# Patient Record
Sex: Female | Born: 1955 | Race: White | Hispanic: Yes | Marital: Married | Smoking: Former smoker
Health system: Southern US, Community
[De-identification: ages and names within clinical notes are randomized; demographics above are authoritative.]

## PROBLEM LIST (undated history)

## (undated) DIAGNOSIS — M069 Rheumatoid arthritis, unspecified: Secondary | ICD-10-CM

## (undated) DIAGNOSIS — M359 Systemic involvement of connective tissue, unspecified: Secondary | ICD-10-CM

## (undated) DIAGNOSIS — M4800 Spinal stenosis, site unspecified: Secondary | ICD-10-CM

## (undated) DIAGNOSIS — I10 Essential (primary) hypertension: Secondary | ICD-10-CM

## (undated) HISTORY — PX: ELBOW ARTHROSCOPY: SHX614

## (undated) HISTORY — PX: ANKLE SURGERY: SHX546

---

## 2006-03-31 ENCOUNTER — Other Ambulatory Visit: Admission: RE | Admit: 2006-03-31 | Discharge: 2006-03-31 | Payer: Self-pay | Admitting: Obstetrics and Gynecology

## 2006-04-29 ENCOUNTER — Encounter: Admission: RE | Admit: 2006-04-29 | Discharge: 2006-04-29 | Payer: Self-pay | Admitting: Obstetrics and Gynecology

## 2007-05-03 ENCOUNTER — Encounter: Admission: RE | Admit: 2007-05-03 | Discharge: 2007-05-03 | Payer: Self-pay | Admitting: Obstetrics and Gynecology

## 2007-05-03 ENCOUNTER — Other Ambulatory Visit: Admission: RE | Admit: 2007-05-03 | Discharge: 2007-05-03 | Payer: Self-pay | Admitting: Obstetrics and Gynecology

## 2007-08-16 ENCOUNTER — Ambulatory Visit (HOSPITAL_COMMUNITY): Admission: RE | Admit: 2007-08-16 | Discharge: 2007-08-16 | Payer: Self-pay | Admitting: Gastroenterology

## 2008-05-04 ENCOUNTER — Encounter: Admission: RE | Admit: 2008-05-04 | Discharge: 2008-05-04 | Payer: Self-pay | Admitting: Obstetrics and Gynecology

## 2008-05-04 ENCOUNTER — Other Ambulatory Visit: Admission: RE | Admit: 2008-05-04 | Discharge: 2008-05-04 | Payer: Self-pay | Admitting: Obstetrics and Gynecology

## 2008-09-12 ENCOUNTER — Encounter (INDEPENDENT_AMBULATORY_CARE_PROVIDER_SITE_OTHER): Payer: Self-pay | Admitting: *Deleted

## 2008-09-12 ENCOUNTER — Ambulatory Visit (HOSPITAL_COMMUNITY): Admission: RE | Admit: 2008-09-12 | Discharge: 2008-09-12 | Payer: Self-pay | Admitting: *Deleted

## 2009-05-10 ENCOUNTER — Other Ambulatory Visit: Admission: RE | Admit: 2009-05-10 | Discharge: 2009-05-10 | Payer: Self-pay | Admitting: Obstetrics and Gynecology

## 2009-05-10 ENCOUNTER — Encounter: Admission: RE | Admit: 2009-05-10 | Discharge: 2009-05-10 | Payer: Self-pay | Admitting: Obstetrics and Gynecology

## 2010-05-16 ENCOUNTER — Encounter: Admission: RE | Admit: 2010-05-16 | Discharge: 2010-05-16 | Payer: Self-pay | Admitting: Obstetrics and Gynecology

## 2010-05-16 ENCOUNTER — Other Ambulatory Visit: Admission: RE | Admit: 2010-05-16 | Discharge: 2010-05-16 | Payer: Self-pay | Admitting: Obstetrics and Gynecology

## 2010-05-22 ENCOUNTER — Encounter: Admission: RE | Admit: 2010-05-22 | Discharge: 2010-05-22 | Payer: Self-pay | Admitting: Obstetrics and Gynecology

## 2011-01-19 ENCOUNTER — Encounter: Payer: Self-pay | Admitting: Obstetrics and Gynecology

## 2011-02-21 ENCOUNTER — Emergency Department (HOSPITAL_BASED_OUTPATIENT_CLINIC_OR_DEPARTMENT_OTHER)
Admission: EM | Admit: 2011-02-21 | Discharge: 2011-02-21 | Disposition: A | Discharge status: Home / Self Care | Payer: Managed Care, Other (non HMO) | Attending: Emergency Medicine | Admitting: Emergency Medicine

## 2011-02-21 ENCOUNTER — Emergency Department (INDEPENDENT_AMBULATORY_CARE_PROVIDER_SITE_OTHER): Payer: Managed Care, Other (non HMO)

## 2011-02-21 DIAGNOSIS — R51 Headache: Secondary | ICD-10-CM | POA: Insufficient documentation

## 2011-02-21 DIAGNOSIS — R42 Dizziness and giddiness: Secondary | ICD-10-CM | POA: Insufficient documentation

## 2011-02-21 DIAGNOSIS — K219 Gastro-esophageal reflux disease without esophagitis: Secondary | ICD-10-CM | POA: Insufficient documentation

## 2011-02-21 DIAGNOSIS — M069 Rheumatoid arthritis, unspecified: Secondary | ICD-10-CM | POA: Insufficient documentation

## 2011-02-21 DIAGNOSIS — I10 Essential (primary) hypertension: Secondary | ICD-10-CM

## 2011-02-21 DIAGNOSIS — R079 Chest pain, unspecified: Secondary | ICD-10-CM | POA: Insufficient documentation

## 2011-02-21 DIAGNOSIS — M542 Cervicalgia: Secondary | ICD-10-CM | POA: Insufficient documentation

## 2011-02-21 DIAGNOSIS — R002 Palpitations: Secondary | ICD-10-CM | POA: Insufficient documentation

## 2011-02-21 DIAGNOSIS — R0789 Other chest pain: Secondary | ICD-10-CM

## 2011-02-21 LAB — COMPREHENSIVE METABOLIC PANEL
Albumin: 4.7 g/dL (ref 3.5–5.2)
BUN: 19 mg/dL (ref 6–23)
CO2: 31 mEq/L (ref 19–32)
Calcium: 10.1 mg/dL (ref 8.4–10.5)
GFR calc non Af Amer: >60 mL/min (ref >60)
Potassium: 4.2 mEq/L (ref 3.5–5.1)
Total Protein: 9.1 g/dL — ABNORMAL HIGH (ref 6.0–8.3)

## 2011-02-21 LAB — URINALYSIS, ROUTINE W REFLEX MICROSCOPIC
Hgb urine dipstick: NEGATIVE
Ketones, ur: NEGATIVE mg/dL
Protein, ur: NEGATIVE mg/dL
Specific Gravity, Urine: 1.007 (ref 1.005–1.030)
pH: 6.5 (ref 5.0–8.0)

## 2011-02-21 LAB — DIFFERENTIAL
Basophils Absolute: 0 10*3/uL (ref 0.0–0.1)
Basophils Relative: 0 % (ref 0–1)
Eosinophils Absolute: 0.2 10*3/uL (ref 0.0–0.7)
Lymphocytes Relative: 44 % (ref 12–46)
Monocytes Absolute: 0.5 10*3/uL (ref 0.1–1.0)

## 2011-02-21 LAB — CBC
HCT: 41.4 % (ref 36.0–46.0)
Hemoglobin: 14.6 g/dL (ref 12.0–15.0)
MCH: 31.7 pg (ref 26.0–34.0)
MCV: 90 fL (ref 78.0–100.0)
Platelets: 181 10*3/uL (ref 150–400)
RDW: 13.9 % (ref 11.5–15.5)
WBC: 5.7 10*3/uL (ref 4.0–10.5)

## 2011-02-21 LAB — POCT CARDIAC MARKERS: Troponin i, poc: <0.05 ng/mL (ref 0.00–0.09)

## 2011-04-01 ENCOUNTER — Other Ambulatory Visit: Payer: Self-pay | Admitting: Obstetrics and Gynecology

## 2011-04-01 DIAGNOSIS — Z1231 Encounter for screening mammogram for malignant neoplasm of breast: Secondary | ICD-10-CM

## 2011-05-13 NOTE — Op Note (Signed)
Sharon Bradshaw, COCKE NO.:  [id number]   MEDICAL RECORD NO.:  [id number]          PATIENT TYPE:  AMB   LOCATION:  DAY                          FACILITY:  [facility]   PHYSICIAN:  Alfonse Ras, MD   DATE OF BIRTH:  05-20-1956   DATE OF PROCEDURE:  DATE OF DISCHARGE:                               OPERATIVE REPORT   PREOPERATIVE DIAGNOSIS:  Grade III internal hemorrhoids.   POSTOPERATIVE DIAGNOSIS:  Grade III internal hemorrhoids.   PROCEDURE:  PTH rectopexy.   SURGEON:  Alfonse Ras, M.D.   ANESTHESIA:  General.   DESCRIPTION:  The patient was taken to the operating room after long  discussion about the risks, benefits, and options of PPH  hemorrhoidectomy with which she wanted to proceed.  She underwent  general anesthesia by endotracheal tube and was placed in a prone jack-  knife position.  Anal dilatation was accomplished with 3 fingerbreadths.  Rectal and perianal prep were undertaken with Betadine.  The  hemorrhoidal bundles were injected with 0.25 Marcaine with Wydase in all  3 quadrants.  The sphincter muscles were then injected with additional  0.25% Marcaine.  A 2-0 Prolene pursestring suture was placed  approximately 5 cm proximal to the dentate line through the mucosa and  submucosa.  Stapler was then introduced and the vagina was checked after  it was closed.  The stapler moved freely.  This was held in place for 45  seconds and then fired.  Staple line was inspected.  There was no  bleeding.  The doughnut was inspected.  There was no evidence of  muscularis on the specimen.  We had a complete doughnut measuring about  1.5 cm to 2 cm in length.  Gelfoam packing was placed.  The patient  tolerated the procedure well and went to PACU in good condition.      Alfonse Ras, MD  Electronically Signed     KRE/MEDQ  D:  09/12/2008  T:  09/12/2008  Job:  [id number]

## 2011-05-13 NOTE — Op Note (Signed)
Sharon Bradshaw, SINGLE NO.:  [id number]   MEDICAL RECORD NO.:  [id number]          PATIENT TYPE:  AMB   LOCATION:  ENDO                         FACILITY:  [facility]   PHYSICIAN:  Petra Kuba, M.D.    DATE OF BIRTH:  1956-12-29   DATE OF PROCEDURE:  08/16/2007  DATE OF DISCHARGE:                               OPERATIVE REPORT   PROCEDURE:  Colonoscopy.   INDICATION:  Screening.   Consent was signed after risks, benefits, methods, options thoroughly  discussed by my nurse and me prior to procedure, by the nurse in the  office.   MEDICINES:  1. Fentanyl 100 mcg.  2. Versed 8 mg.   PROCEDURE:  Rectal inspection is pertinent for external hemorrhoids,  moderate.  Digital exam was negative.  A pediatric colonoscope was  inserted and with abdominal pressure easily able to be advanced to the  cecum.  The preparation was adequate, there was a moderate amount of  liquid stool that was easily suctioned and washed.  The cecum was  identified by the appendiceal orifice and the ileocecal valve.  No  abnormalities were seen on insertion.  On slow withdrawal through the  colon, the cecum, ascending and transverse were normal.  Just distal to  the splenic flexure a questionable tiny polyp was seen; however, as we  passed the biopsy forceps and placed them there __________ we could not  find it with multiple advancements and withdrawals.  We elected to  withdraw further.  No other abnormalities were seen as we withdrew back  to the rectum.  Anorectal pull-through and retroflexion confirmed some  small hemorrhoids.  Scope was straightened and readvanced a short ways  up the left side of the colon.  Air was suctioned, scope removed.  Patient tolerated the procedure well.  There was no obvious immediate  complication.   ENDOSCOPIC DIAGNOSES:  1. External greater than internal hemorrhoids.  2. Questionable tiny proximal descending polyp not biopsied, possibly      flattened out  with air insufflation.  3. Otherwise within normal limits to the cecum.  Have her seen back      p.r.n.  Otherwise, repeat colon screening in 5 years just to make      sure the tiny polyp seen was not just missed.           ______________________________  Petra Kuba, M.D.     MEM/MEDQ  D:  08/16/2007  T:  08/16/2007  Job:  [id number]   cc:   Gerald Leitz, MD   Joycelyn Rua, M.D.  Fax: [phone]

## 2011-05-22 ENCOUNTER — Ambulatory Visit: Payer: Managed Care, Other (non HMO)

## 2011-06-10 ENCOUNTER — Other Ambulatory Visit: Payer: Self-pay | Admitting: Obstetrics and Gynecology

## 2011-06-10 ENCOUNTER — Other Ambulatory Visit (HOSPITAL_COMMUNITY)
Admission: RE | Admit: 2011-06-10 | Discharge: 2011-06-10 | Disposition: A | Discharge status: Home / Self Care | Payer: Managed Care, Other (non HMO) | Source: Ambulatory Visit | Attending: Obstetrics and Gynecology | Admitting: Obstetrics and Gynecology

## 2011-06-10 ENCOUNTER — Ambulatory Visit
Admission: RE | Admit: 2011-06-10 | Discharge: 2011-06-10 | Disposition: A | Discharge status: Home / Self Care | Payer: Managed Care, Other (non HMO) | Source: Ambulatory Visit | Attending: Obstetrics and Gynecology | Admitting: Obstetrics and Gynecology

## 2011-06-10 DIAGNOSIS — Z01419 Encounter for gynecological examination (general) (routine) without abnormal findings: Secondary | ICD-10-CM | POA: Insufficient documentation

## 2011-06-10 DIAGNOSIS — Z1231 Encounter for screening mammogram for malignant neoplasm of breast: Secondary | ICD-10-CM

## 2011-10-01 LAB — BASIC METABOLIC PANEL
BUN: 15
CO2: 34 — ABNORMAL HIGH
Calcium: 10.3
Chloride: 102
Creatinine, Ser: 0.68
GFR calc Af Amer: >60
Sodium: 142

## 2012-05-05 ENCOUNTER — Other Ambulatory Visit: Payer: Self-pay | Admitting: Obstetrics and Gynecology

## 2012-05-05 DIAGNOSIS — Z1231 Encounter for screening mammogram for malignant neoplasm of breast: Secondary | ICD-10-CM

## 2012-06-10 ENCOUNTER — Other Ambulatory Visit (HOSPITAL_COMMUNITY)
Admission: RE | Admit: 2012-06-10 | Discharge: 2012-06-10 | Disposition: A | Discharge status: Home / Self Care | Payer: BC Managed Care – PPO | Source: Ambulatory Visit | Attending: Obstetrics and Gynecology | Admitting: Obstetrics and Gynecology

## 2012-06-10 ENCOUNTER — Other Ambulatory Visit: Payer: Self-pay | Admitting: Obstetrics and Gynecology

## 2012-06-10 ENCOUNTER — Ambulatory Visit
Admission: RE | Admit: 2012-06-10 | Discharge: 2012-06-10 | Disposition: A | Discharge status: Home / Self Care | Payer: BC Managed Care – PPO | Source: Ambulatory Visit | Attending: Obstetrics and Gynecology | Admitting: Obstetrics and Gynecology

## 2012-06-10 DIAGNOSIS — Z1231 Encounter for screening mammogram for malignant neoplasm of breast: Secondary | ICD-10-CM

## 2012-06-10 DIAGNOSIS — Z01419 Encounter for gynecological examination (general) (routine) without abnormal findings: Secondary | ICD-10-CM | POA: Insufficient documentation

## 2012-06-10 DIAGNOSIS — Z1159 Encounter for screening for other viral diseases: Secondary | ICD-10-CM | POA: Insufficient documentation

## 2013-06-16 ENCOUNTER — Other Ambulatory Visit: Payer: Self-pay

## 2013-06-16 DIAGNOSIS — Z1231 Encounter for screening mammogram for malignant neoplasm of breast: Secondary | ICD-10-CM

## 2013-08-02 ENCOUNTER — Ambulatory Visit
Admission: RE | Admit: 2013-08-02 | Discharge: 2013-08-02 | Disposition: A | Discharge status: Home / Self Care | Payer: BC Managed Care – PPO | Source: Ambulatory Visit

## 2013-08-02 DIAGNOSIS — Z1231 Encounter for screening mammogram for malignant neoplasm of breast: Secondary | ICD-10-CM

## 2014-02-04 ENCOUNTER — Encounter: Payer: Self-pay | Admitting: *Deleted

## 2014-02-04 DIAGNOSIS — M8580 Other specified disorders of bone density and structure, unspecified site: Secondary | ICD-10-CM

## 2014-02-04 DIAGNOSIS — I10 Essential (primary) hypertension: Secondary | ICD-10-CM | POA: Insufficient documentation

## 2014-03-28 ENCOUNTER — Other Ambulatory Visit: Payer: Self-pay

## 2014-07-18 ENCOUNTER — Other Ambulatory Visit: Payer: Self-pay

## 2014-07-18 DIAGNOSIS — Z1231 Encounter for screening mammogram for malignant neoplasm of breast: Secondary | ICD-10-CM

## 2014-08-16 ENCOUNTER — Ambulatory Visit: Payer: BC Managed Care – PPO

## 2014-09-12 ENCOUNTER — Other Ambulatory Visit: Payer: Self-pay | Admitting: Obstetrics and Gynecology

## 2014-09-12 ENCOUNTER — Encounter (INDEPENDENT_AMBULATORY_CARE_PROVIDER_SITE_OTHER): Payer: Self-pay

## 2014-09-12 ENCOUNTER — Other Ambulatory Visit (HOSPITAL_COMMUNITY)
Admission: RE | Admit: 2014-09-12 | Discharge: 2014-09-12 | Disposition: A | Discharge status: Home / Self Care | Payer: BC Managed Care – PPO | Source: Ambulatory Visit | Attending: Obstetrics and Gynecology | Admitting: Obstetrics and Gynecology

## 2014-09-12 ENCOUNTER — Ambulatory Visit
Admission: RE | Admit: 2014-09-12 | Discharge: 2014-09-12 | Disposition: A | Discharge status: Home / Self Care | Payer: BC Managed Care – PPO | Source: Ambulatory Visit

## 2014-09-12 DIAGNOSIS — Z01419 Encounter for gynecological examination (general) (routine) without abnormal findings: Secondary | ICD-10-CM | POA: Diagnosis present

## 2014-09-12 DIAGNOSIS — Z1231 Encounter for screening mammogram for malignant neoplasm of breast: Secondary | ICD-10-CM

## 2014-09-13 LAB — CYTOLOGY - PAP

## 2015-07-03 ENCOUNTER — Other Ambulatory Visit: Payer: Self-pay

## 2015-07-03 DIAGNOSIS — Z1231 Encounter for screening mammogram for malignant neoplasm of breast: Secondary | ICD-10-CM

## 2015-09-20 ENCOUNTER — Other Ambulatory Visit: Payer: Self-pay | Admitting: Obstetrics and Gynecology

## 2015-09-20 ENCOUNTER — Ambulatory Visit
Admission: RE | Admit: 2015-09-20 | Discharge: 2015-09-20 | Disposition: A | Discharge status: Home / Self Care | Payer: BLUE CROSS/BLUE SHIELD | Source: Ambulatory Visit

## 2015-09-20 ENCOUNTER — Other Ambulatory Visit (HOSPITAL_COMMUNITY)
Admission: RE | Admit: 2015-09-20 | Discharge: 2015-09-20 | Disposition: A | Discharge status: Home / Self Care | Payer: BLUE CROSS/BLUE SHIELD | Source: Ambulatory Visit | Attending: Obstetrics and Gynecology | Admitting: Obstetrics and Gynecology

## 2015-09-20 DIAGNOSIS — Z1151 Encounter for screening for human papillomavirus (HPV): Secondary | ICD-10-CM | POA: Insufficient documentation

## 2015-09-20 DIAGNOSIS — Z01419 Encounter for gynecological examination (general) (routine) without abnormal findings: Secondary | ICD-10-CM | POA: Diagnosis present

## 2015-09-20 DIAGNOSIS — Z1231 Encounter for screening mammogram for malignant neoplasm of breast: Secondary | ICD-10-CM

## 2015-09-24 LAB — CYTOLOGY - PAP

## 2015-10-29 ENCOUNTER — Inpatient Hospital Stay (HOSPITAL_BASED_OUTPATIENT_CLINIC_OR_DEPARTMENT_OTHER)
Admission: EM | Admit: 2015-10-29 | Discharge: 2015-10-31 | DRG: 337 | Disposition: A | Discharge status: Home / Self Care | Payer: BLUE CROSS/BLUE SHIELD | Attending: General Surgery | Admitting: General Surgery

## 2015-10-29 ENCOUNTER — Ambulatory Visit (HOSPITAL_BASED_OUTPATIENT_CLINIC_OR_DEPARTMENT_OTHER)
Admission: RE | Admit: 2015-10-29 | Discharge: 2015-10-29 | Disposition: A | Discharge status: Home / Self Care | Payer: BLUE CROSS/BLUE SHIELD | Source: Ambulatory Visit | Attending: Family Medicine | Admitting: Family Medicine

## 2015-10-29 ENCOUNTER — Encounter (HOSPITAL_BASED_OUTPATIENT_CLINIC_OR_DEPARTMENT_OTHER): Payer: Self-pay | Admitting: *Deleted

## 2015-10-29 ENCOUNTER — Other Ambulatory Visit (HOSPITAL_BASED_OUTPATIENT_CLINIC_OR_DEPARTMENT_OTHER): Payer: Self-pay | Admitting: Family Medicine

## 2015-10-29 ENCOUNTER — Encounter (HOSPITAL_BASED_OUTPATIENT_CLINIC_OR_DEPARTMENT_OTHER): Payer: Self-pay

## 2015-10-29 DIAGNOSIS — Z888 Allergy status to other drugs, medicaments and biological substances status: Secondary | ICD-10-CM

## 2015-10-29 DIAGNOSIS — Z886 Allergy status to analgesic agent status: Secondary | ICD-10-CM

## 2015-10-29 DIAGNOSIS — M069 Rheumatoid arthritis, unspecified: Secondary | ICD-10-CM | POA: Diagnosis present

## 2015-10-29 DIAGNOSIS — Z833 Family history of diabetes mellitus: Secondary | ICD-10-CM

## 2015-10-29 DIAGNOSIS — I10 Essential (primary) hypertension: Secondary | ICD-10-CM | POA: Diagnosis present

## 2015-10-29 DIAGNOSIS — R1084 Generalized abdominal pain: Secondary | ICD-10-CM

## 2015-10-29 DIAGNOSIS — Z87891 Personal history of nicotine dependence: Secondary | ICD-10-CM

## 2015-10-29 DIAGNOSIS — Z8249 Family history of ischemic heart disease and other diseases of the circulatory system: Secondary | ICD-10-CM

## 2015-10-29 DIAGNOSIS — K3580 Unspecified acute appendicitis: Principal | ICD-10-CM | POA: Diagnosis present

## 2015-10-29 DIAGNOSIS — M359 Systemic involvement of connective tissue, unspecified: Secondary | ICD-10-CM | POA: Diagnosis present

## 2015-10-29 DIAGNOSIS — K660 Peritoneal adhesions (postprocedural) (postinfection): Secondary | ICD-10-CM | POA: Diagnosis present

## 2015-10-29 DIAGNOSIS — Z91040 Latex allergy status: Secondary | ICD-10-CM

## 2015-10-29 DIAGNOSIS — Z809 Family history of malignant neoplasm, unspecified: Secondary | ICD-10-CM

## 2015-10-29 DIAGNOSIS — Z885 Allergy status to narcotic agent status: Secondary | ICD-10-CM

## 2015-10-29 HISTORY — DX: Essential (primary) hypertension: I10

## 2015-10-29 HISTORY — DX: Systemic involvement of connective tissue, unspecified: M35.9

## 2015-10-29 HISTORY — DX: Rheumatoid arthritis, unspecified: M06.9

## 2015-10-29 HISTORY — DX: Spinal stenosis, site unspecified: M48.00

## 2015-10-29 LAB — COMPREHENSIVE METABOLIC PANEL
ALK PHOS: 73 U/L (ref 38–126)
ALT: 32 U/L (ref 14–54)
AST: 30 U/L (ref 15–41)
Albumin: 4.6 g/dL (ref 3.5–5.0)
Anion gap: 11 (ref 5–15)
BUN: 14 mg/dL (ref 6–20)
CALCIUM: 9.3 mg/dL (ref 8.9–10.3)
CHLORIDE: 96 mmol/L — AB (ref 101–111)
CO2: 25 mmol/L (ref 22–32)
CREATININE: 0.59 mg/dL (ref 0.44–1.00)
GFR calc Af Amer: >60 mL/min (ref >60)
GFR calc non Af Amer: >60 mL/min (ref >60)
Glucose, Bld: 120 mg/dL — ABNORMAL HIGH (ref 65–99)
Potassium: 3.4 mmol/L — ABNORMAL LOW (ref 3.5–5.1)
Sodium: 132 mmol/L — ABNORMAL LOW (ref 135–145)
Total Bilirubin: 1 mg/dL (ref 0.3–1.2)
Total Protein: 8.7 g/dL — ABNORMAL HIGH (ref 6.5–8.1)

## 2015-10-29 MED ORDER — ONDANSETRON HCL 4 MG/2ML IJ SOLN
4.0000 mg | Freq: Once | INTRAMUSCULAR | Status: AC | PRN
  Administered 2015-10-30: 4 mg via INTRAVENOUS

## 2015-10-29 MED ORDER — ACETAMINOPHEN 325 MG PO TABS
650.0000 mg | ORAL_TABLET | Freq: Once | ORAL | Status: AC
Start: 2015-10-29 — End: 2015-10-29
  Administered 2015-10-29: 650 mg via ORAL
  Filled 2015-10-29: qty 2

## 2015-10-29 MED ORDER — HYDROMORPHONE HCL 1 MG/ML IJ SOLN: 1.0000 mg | INTRAMUSCULAR | Status: DC | PRN

## 2015-10-29 MED ORDER — CEFTRIAXONE SODIUM 2 G IJ SOLR
2.0000 g | INTRAMUSCULAR | Status: DC
  Administered 2015-10-30: 2 g via INTRAVENOUS
  Filled 2015-10-29: qty 2

## 2015-10-29 MED ORDER — ACETAMINOPHEN 325 MG PO TABS: 650.0000 mg | ORAL_TABLET | Freq: Four times a day (QID) | ORAL | Status: DC | PRN

## 2015-10-29 MED ORDER — ONDANSETRON HCL 4 MG/2ML IJ SOLN
4.0000 mg | Freq: Once | INTRAMUSCULAR | Status: AC
  Administered 2015-10-29: 4 mg via INTRAVENOUS
  Filled 2015-10-29: qty 2

## 2015-10-29 MED ORDER — SODIUM CHLORIDE 0.9 % IV BOLUS (SEPSIS)
1000.0000 mL | Freq: Once | INTRAVENOUS | Status: AC
  Administered 2015-10-29: 1000 mL via INTRAVENOUS

## 2015-10-29 MED ORDER — ONDANSETRON 4 MG PO TBDP: 4.0000 mg | ORAL_TABLET | Freq: Four times a day (QID) | ORAL | Status: DC | PRN

## 2015-10-29 MED ORDER — ACETAMINOPHEN 650 MG RE SUPP: 650.0000 mg | Freq: Four times a day (QID) | RECTAL | Status: DC | PRN

## 2015-10-29 MED ORDER — METRONIDAZOLE IN NACL 5-0.79 MG/ML-% IV SOLN
500.0000 mg | Freq: Three times a day (TID) | INTRAVENOUS | Status: DC
  Administered 2015-10-30 (×2): 500 mg via INTRAVENOUS
  Filled 2015-10-29 (×3): qty 100

## 2015-10-29 MED ORDER — AMPICILLIN-SULBACTAM SODIUM 3 (2-1) G IJ SOLR
3.0000 g | Freq: Once | INTRAMUSCULAR | Status: AC
  Administered 2015-10-29: 3 g via INTRAVENOUS
  Filled 2015-10-29: qty 3

## 2015-10-29 MED ORDER — MORPHINE SULFATE (PF) 4 MG/ML IV SOLN: 4.0000 mg | INTRAVENOUS | Status: DC | PRN

## 2015-10-29 MED ORDER — FENTANYL CITRATE (PF) 100 MCG/2ML IJ SOLN
50.0000 ug | Freq: Once | INTRAMUSCULAR | Status: AC
  Administered 2015-10-29: 50 ug via INTRAVENOUS
  Filled 2015-10-29: qty 2

## 2015-10-29 MED ORDER — IOHEXOL 300 MG/ML  SOLN
100.0000 mL | Freq: Once | INTRAMUSCULAR | Status: AC | PRN
  Administered 2015-10-29: 100 mL via INTRAVENOUS

## 2015-10-29 MED ORDER — ONDANSETRON HCL 4 MG/2ML IJ SOLN: 4.0000 mg | Freq: Four times a day (QID) | INTRAMUSCULAR | Status: DC | PRN

## 2015-10-29 MED ORDER — KCL IN DEXTROSE-NACL 20-5-0.45 MEQ/L-%-% IV SOLN
INTRAVENOUS | Status: DC
  Administered 2015-10-30: via INTRAVENOUS
  Filled 2015-10-29 (×2): qty 1000

## 2015-10-29 NOTE — Anesthesia Preprocedure Evaluation (Addendum)
Anesthesia Evaluation  Patient identified by MRN, date of birth, ID band Patient awake    Reviewed: Allergy & Precautions, H&P , NPO status , Patient's Chart, lab work & pertinent test results  Airway Mallampati: II       Dental  (+) Dental Advisory Given, Caps Right upper front capped:   Pulmonary neg pulmonary ROS, former smoker,    Pulmonary exam normal        Cardiovascular hypertension, Pt. on medications Normal cardiovascular exam     Neuro/Psych negative neurological ROS  negative psych ROS   GI/Hepatic negative GI ROS, Neg liver ROS,   Endo/Other  negative endocrine ROS  Renal/GU negative Renal ROS  negative genitourinary   Musculoskeletal  (+) Arthritis , Rheumatoid disorders,    Abdominal   Peds  Hematology negative hematology ROS (+)   Anesthesia Other Findings Collagen vascular disease  Reproductive/Obstetrics negative OB ROS                          Anesthesia Physical Anesthesia Plan  ASA: III  Anesthesia Plan: General   Post-op Pain Management:    Induction: Intravenous, Rapid sequence and Cricoid pressure planned  Airway Management Planned: Oral ETT  Additional Equipment:   Intra-op Plan:   Post-operative Plan: Extubation in OR  Informed Consent: I have reviewed the patients History and Physical, chart, labs and discussed the procedure including the risks, benefits and alternatives for the proposed anesthesia with the patient or authorized representative who has indicated his/her understanding and acceptance.   Dental advisory given  Plan Discussed with: CRNA  Anesthesia Plan Comments:        Anesthesia Quick Evaluation

## 2015-10-29 NOTE — ED Provider Notes (Signed)
CSN: [id number]     Arrival date & time 10/29/15  1758 History  By signing my name below, I, Tanda Rockers, attest that this documentation has been prepared under the direction and in the presence of Tilden Fossa, MD. Electronically Signed: Tanda Rockers, ED Scribe. 10/29/2015. 6:38 PM.  Chief Complaint  Patient presents with  . Abdominal Pain    Known Appendicitis   The history is provided by the patient. No language interpreter was used.     HPI Comments: Sharon Bradshaw is a 59 y.o. female who presents to the Emergency Department for known appendicitis. Pt states that 3 days ago she began having sudden onset, constant, severe, RLQ abdominal pain that was gradually worsening. Pt also complains of nausea. She was seen at [facility] today for same symptoms and was sent here for an out patient CT with findings of known appendicitis, prompting her to come to the ED. Denies fever, chills, vomiting, or any other associated symptoms. PSHx cesarean section (x2).   Past Medical History  Diagnosis Date  . Hypertension   . Collagen vascular disease (HCC)   . RA (rheumatoid arthritis) (HCC)   . Spinal stenosis    Past Surgical History  Procedure Laterality Date  . Cesarean section    . Elbow arthroscopy    . Ankle surgery  tendon   Family History  Problem Relation Age of Onset  . Coronary artery disease Mother   . Hypertension Mother   . Diabetes Mellitus I Mother   . Hypertension Father   . Coronary artery disease Father   . Cancer Maternal Aunt    Social History  Substance Use Topics  . Smoking status: Former Games developer  . Smokeless tobacco: Former Neurosurgeon    Quit date: 02/05/1980  . Alcohol Use: Yes     Comment: wine daily   OB History    No data available     Review of Systems  Constitutional: Negative for fever and chills.  Gastrointestinal: Positive for nausea and abdominal pain (RLQ). Negative for vomiting and diarrhea.  All other systems reviewed and are  negative.   Allergies  Aspirin; Latex; Lisinopril; and Percocet  Home Medications   Prior to Admission medications   Medication Sig Start Date End Date Taking? Authorizing Provider  calcium carbonate (OS-CAL) 600 MG TABS tablet Take 600 mg by mouth 2 (two) times daily with a meal.   Yes Historical Provider, MD  celecoxib (CELEBREX) 200 MG capsule Take 200 mg by mouth 2 (two) times daily.   Yes Historical Provider, MD  cholecalciferol (VITAMIN D) 1000 UNITS tablet Take 1,000 Units by mouth daily.   Yes Historical Provider, MD  Coenzyme Q10 (COQ-10 PO) Take by mouth daily.   Yes Historical Provider, MD  Cyanocobalamin (VITAMIN B-12 PO) Take by mouth daily.   Yes Historical Provider, MD  cyclobenzaprine (FLEXERIL) 10 MG tablet Take 10 mg by mouth at bedtime.   Yes Historical Provider, MD  hydrochlorothiazide (HYDRODIURIL) 25 MG tablet Take 25 mg by mouth daily.   Yes Historical Provider, MD  lansoprazole (PREVACID) 30 MG capsule Take 30 mg by mouth daily at 12 noon.   Yes Historical Provider, MD  latanoprost (XALATAN) 0.005 % ophthalmic solution 1 drop at bedtime.   Yes Historical Provider, MD  MAGNESIUM-OXIDE PO Take by mouth daily.   Yes Historical Provider, MD  Milk Thistle 140 MG CAPS Take by mouth daily.   Yes Historical Provider, MD  Omega-3 Fatty Acids (FISH OIL) 1000  MG CAPS Take by mouth.   Yes Historical Provider, MD  Tofacitinib Citrate (XELJANZ) 5 MG TABS Take by mouth 2 (two) times daily.   Yes Historical Provider, MD  triamcinolone cream (KENALOG) 0.1 % Apply 1 application topically 2 (two) times daily.   Yes Historical Provider, MD  adalimumab (HUMIRA) 40 MG/0.8ML injection Inject 40 mg into the skin once.    Historical Provider, MD   Triage Vitals: BP 159/99 mmHg  Pulse 100  Temp(Src) 98.8 F (37.1 C) (Oral)  Resp 20  Ht 5' 2.5" (1.588 m)  Wt 170 lb (77.111 kg)  BMI 30.58 kg/m2  SpO2 99%   Physical Exam  Constitutional: She is oriented to person, place, and time. She  appears well-developed and well-nourished.  HENT:  Head: Normocephalic and atraumatic.  Cardiovascular: Normal rate and regular rhythm.   No murmur heard. Pulmonary/Chest: Effort normal and breath sounds normal. No respiratory distress.  Abdominal: Soft. She exhibits distension. There is tenderness. There is guarding. There is no rebound.  Distended abdomen with moderate diffuse tenderness greatest over RLQ Voluntary guarding  No rebound  Musculoskeletal: She exhibits no edema or tenderness.  Neurological: She is alert and oriented to person, place, and time.  Skin: Skin is warm and dry.  Psychiatric: She has a normal mood and affect. Her behavior is normal.  Nursing note and vitals reviewed.   ED Course  Procedures (including critical care time)  DIAGNOSTIC STUDIES: Oxygen Saturation is 99% on RA, normal by my interpretation.    COORDINATION OF CARE: 6:35 PM-Discussed treatment plan which includes consult to general surgery with pt at bedside and pt agreed to plan.   Labs Review Labs Reviewed - No data to display  Imaging Review Ct Abdomen Pelvis W Contrast  10/29/2015  CLINICAL DATA:  Right lower quadrant pain for the past 2 days. Nausea. Low-grade fever. EXAM: CT ABDOMEN AND PELVIS WITH CONTRAST TECHNIQUE: Multidetector CT imaging of the abdomen and pelvis was performed using the standard protocol following bolus administration of intravenous contrast. CONTRAST:  OMNIPAQUE IOHEXOL 300 MG/ML  SOLN COMPARISON:  None. FINDINGS: Diffuse low density of the liver relative to the spleen. Calcified granuloma in the liver. Enlarged appendix containing an appendicolith and fluid with diffuse, enhancing wall thickening and mild periappendiceal soft tissue stranding. The appendicolith measures 10 mm in diameter in the maximum diameter of the appendix is 1.6 cm. No free peritoneal fluid or air. Normal appearing spleen, pancreas, gallbladder, adrenal glands, kidneys, ureters, urinary  bladder, uterus and ovaries. No gastrointestinal abnormalities or enlarged lymph nodes. Clear lung bases. Lumbar and lower thoracic spine degenerative changes. IMPRESSION: 1. Acute appendicitis without abscess. 2. Diffuse hepatic steatosis. These results will be called to the ordering clinician or representative by the Radiologist Assistant, and communication documented in the PACS or zVision Dashboard. Electronically Signed   By: Beckie Salts M.D.   On: 10/29/2015 17:15   I have personally reviewed and evaluated these images and lab results as part of my medical decision-making.   EKG Interpretation None      MDM   Final diagnoses:  Acute appendicitis, unspecified acute appendicitis type   Patient referred following outpatient CT scan for acute appendicitis. History, examination, CT scan is consistent with appendicitis. CBC from [facility] physicians demonstrates white blood cell count of 10.5, hemoglobin of 15.9, platelets of 200. This with Dr. Gerrit Friends who accepted the patient in transfer. Patient will be transferred to the [facility] emergency department pending surgical evaluation.  I personally  performed the services described in this documentation, which was scribed in my presence. The recorded information has been reviewed and is accurate.      Tilden Fossa, MD 10/30/15 [phone]

## 2015-10-29 NOTE — ED Notes (Signed)
Bed: WA24 Expected date:  Expected time:  Means of arrival:  Comments: From [facility]

## 2015-10-29 NOTE — ED Notes (Signed)
Sent from out patient CT with dx of appendicitis

## 2015-10-29 NOTE — H&P (Signed)
Sharon Bradshaw is an 59 y.o. female.    General Surgery [facility]  Chief Complaint: abdominal pain, acute appendicitis  HPI:  59 yo female with 2 day hx of crampy abd pain, nausea, sweats, and chills.  Low grade fever.  Presented to primary MD at [facility], referred to [place] for CT scan.  Scan shows evidence acute appendicitis with an appendicolith.  No sign of perforation.  IV abx started en route to [facility] ER.  Previous C-section x 2.  Past Medical History  Diagnosis Date  . Hypertension   . Collagen vascular disease ([place])   . RA (rheumatoid arthritis) ([place])   . Spinal stenosis     Past Surgical History  Procedure Laterality Date  . Cesarean section    . Elbow arthroscopy    . Ankle surgery  tendon    Family History  Problem Relation Age of Onset  . Coronary artery disease Mother   . Hypertension Mother   . Diabetes Mellitus I Mother   . Hypertension Father   . Coronary artery disease Father   . Cancer Maternal Aunt    Social History:  reports that she has quit smoking. She quit smokeless tobacco use about 35 years ago. She reports that she drinks alcohol. She reports that she does not use illicit drugs.  Allergies:  Allergies  Allergen Reactions  . Aspirin Hives  . Latex Hives  . Lisinopril Swelling  . Percocet [Oxycodone-Acetaminophen]     DIZZINESS      (Not in a hospital admission)  Results for orders placed or performed during the hospital encounter of 10/29/15 (from the past 48 hour(s))  Comprehensive metabolic panel     Status: Abnormal   Collection Time: 10/29/15  6:45 PM  Result Value Ref Range   Sodium 132 (L) 135 - 145 mmol/L   Potassium 3.4 (L) 3.5 - 5.1 mmol/L   Chloride 96 (L) 101 - 111 mmol/L   CO2 25 22 - 32 mmol/L   Glucose, Bld 120 (H) 65 - 99 mg/dL   BUN 14 6 - 20 mg/dL   Creatinine, Ser 0.59 0.44 - 1.00 mg/dL   Calcium 9.3 8.9 - 10.3 mg/dL   Total Protein 8.7 (H) 6.5 - 8.1 g/dL   Albumin 4.6 3.5 - 5.0 g/dL    AST 30 15 - 41 U/L   ALT 32 14 - 54 U/L   Alkaline Phosphatase 73 38 - 126 U/L   Total Bilirubin 1.0 0.3 - 1.2 mg/dL   GFR calc non Af Amer >60 >60 mL/min   GFR calc Af Amer >60 >60 mL/min    Comment: (NOTE) The eGFR has been calculated using the CKD EPI equation. This calculation has not been validated in all clinical situations. eGFR's persistently <60 mL/min signify possible Chronic Kidney Disease.    Anion gap 11 5 - 15   Ct Abdomen Pelvis W Contrast  10/29/2015  CLINICAL DATA:  Right lower quadrant pain for the past 2 days. Nausea. Low-grade fever. EXAM: CT ABDOMEN AND PELVIS WITH CONTRAST TECHNIQUE: Multidetector CT imaging of the abdomen and pelvis was performed using the standard protocol following bolus administration of intravenous contrast. CONTRAST:  111m OMNIPAQUE IOHEXOL 300 MG/ML  SOLN COMPARISON:  None. FINDINGS: Diffuse low density of the liver relative to the spleen. Calcified granuloma in the liver. Enlarged appendix containing an appendicolith and fluid with diffuse, enhancing wall thickening and mild periappendiceal soft tissue stranding. The appendicolith measures 10 mm in  diameter in the maximum diameter of the appendix is 1.6 cm. No free peritoneal fluid or air. Normal appearing spleen, pancreas, gallbladder, adrenal glands, kidneys, ureters, urinary bladder, uterus and ovaries. No gastrointestinal abnormalities or enlarged lymph nodes. Clear lung bases. Lumbar and lower thoracic spine degenerative changes. IMPRESSION: 1. Acute appendicitis without abscess. 2. Diffuse hepatic steatosis. These results will be called to the ordering clinician or representative by the Radiologist Assistant, and communication documented in the PACS or zVision Dashboard. Electronically Signed   By: Claudie Revering M.D.   On: 10/29/2015 17:15    Review of Systems  Constitutional: Positive for fever and chills. Negative for weight loss, malaise/fatigue and diaphoresis.  Eyes: Negative.    Respiratory: Negative.   Cardiovascular: Negative.   Gastrointestinal: Positive for nausea and abdominal pain (localizing to RLQ). Negative for vomiting.  Genitourinary: Negative.   Musculoskeletal: Positive for back pain.  Skin: Negative.   Neurological: Positive for headaches. Negative for weakness.  Endo/Heme/Allergies: Negative.   Psychiatric/Behavioral: Negative.     Blood pressure 150/75, pulse 87, temperature 99.4 F (37.4 C), temperature source Oral, resp. rate 18, height 5' 2.5" (1.588 m), weight 77.111 kg (170 lb), SpO2 94 %. Physical Exam  Constitutional: She is oriented to person, place, and time. She appears well-developed and well-nourished. No distress.  HENT:  Head: Normocephalic and atraumatic.  Right Ear: External ear normal.  Left Ear: External ear normal.  Eyes: Conjunctivae are normal. Pupils are equal, round, and reactive to light. No scleral icterus.  Neck: Normal range of motion. Neck supple. No tracheal deviation present. No thyromegaly present.  Cardiovascular: Normal rate, regular rhythm and normal heart sounds.   No murmur heard. Respiratory: Effort normal and breath sounds normal. No respiratory distress. She has no wheezes.  GI: Soft. Bowel sounds are normal. She exhibits no distension and no mass. There is tenderness (mild diffuse tenderness, max in RLQ). There is guarding. There is no rebound.  Musculoskeletal: Normal range of motion. She exhibits no edema or tenderness.  Neurological: She is alert and oriented to person, place, and time.  Skin: Skin is warm and dry. She is not diaphoretic.  Psychiatric: She has a normal mood and affect. Her behavior is normal.     Assessment/Plan Acute appendicitis  Plan lap appendectomy in AM 11/1 by Dr. Fanny Skates  IV Cefoxitin started en route  Rx pain, nausea  Patient and husband are in agreement with plans for admission and surgery  The risks and benefits of the procedure have been discussed at length  with the patient.  The patient understands the proposed procedure, potential alternative treatments, and the course of recovery to be expected.  All of the patient's questions have been answered at this time.  The patient wishes to proceed with surgery.  Earnstine Regal, MD, [facility] Office: [place] 10/29/2015, 9:50 PM

## 2015-10-29 NOTE — ED Notes (Signed)
Patient transferred from [facility].

## 2015-10-29 NOTE — ED Provider Notes (Signed)
Transfer from [facility] with appendicitis. Received abx prior to transfer. No new complaints since last evaluated. PRN pain meds ordered. Pt/family updated that Dr Gerrit Friends is currently in OR with emergent case and will be by as soon as he is available.   Sharon Razor, MD 10/29/15 2024

## 2015-10-30 ENCOUNTER — Encounter (HOSPITAL_COMMUNITY): Payer: Self-pay | Admitting: Anesthesiology

## 2015-10-30 ENCOUNTER — Observation Stay (HOSPITAL_COMMUNITY): Payer: BLUE CROSS/BLUE SHIELD | Admitting: Anesthesiology

## 2015-10-30 ENCOUNTER — Encounter (HOSPITAL_COMMUNITY)
Admission: EM | Disposition: A | Discharge status: Home / Self Care | Payer: BLUE CROSS/BLUE SHIELD | Source: Home / Self Care

## 2015-10-30 DIAGNOSIS — Z885 Allergy status to narcotic agent status: Secondary | ICD-10-CM | POA: Diagnosis not present

## 2015-10-30 DIAGNOSIS — Z833 Family history of diabetes mellitus: Secondary | ICD-10-CM | POA: Diagnosis not present

## 2015-10-30 DIAGNOSIS — K3580 Unspecified acute appendicitis: Secondary | ICD-10-CM | POA: Diagnosis present

## 2015-10-30 DIAGNOSIS — M069 Rheumatoid arthritis, unspecified: Secondary | ICD-10-CM | POA: Diagnosis present

## 2015-10-30 DIAGNOSIS — I10 Essential (primary) hypertension: Secondary | ICD-10-CM | POA: Diagnosis present

## 2015-10-30 DIAGNOSIS — Z87891 Personal history of nicotine dependence: Secondary | ICD-10-CM | POA: Diagnosis not present

## 2015-10-30 DIAGNOSIS — K660 Peritoneal adhesions (postprocedural) (postinfection): Secondary | ICD-10-CM | POA: Diagnosis present

## 2015-10-30 DIAGNOSIS — Z809 Family history of malignant neoplasm, unspecified: Secondary | ICD-10-CM | POA: Diagnosis not present

## 2015-10-30 DIAGNOSIS — Z888 Allergy status to other drugs, medicaments and biological substances status: Secondary | ICD-10-CM | POA: Diagnosis not present

## 2015-10-30 DIAGNOSIS — Z8249 Family history of ischemic heart disease and other diseases of the circulatory system: Secondary | ICD-10-CM | POA: Diagnosis not present

## 2015-10-30 DIAGNOSIS — Z886 Allergy status to analgesic agent status: Secondary | ICD-10-CM | POA: Diagnosis not present

## 2015-10-30 DIAGNOSIS — M359 Systemic involvement of connective tissue, unspecified: Secondary | ICD-10-CM | POA: Diagnosis present

## 2015-10-30 DIAGNOSIS — Z91040 Latex allergy status: Secondary | ICD-10-CM | POA: Diagnosis not present

## 2015-10-30 HISTORY — PX: LAPAROSCOPIC APPENDECTOMY: SHX408

## 2015-10-30 LAB — CREATININE, SERUM
CREATININE: 0.67 mg/dL (ref 0.44–1.00)
GFR calc Af Amer: >60 mL/min (ref >60)

## 2015-10-30 LAB — CBC
HCT: 43.3 % (ref 36.0–46.0)
Hemoglobin: 14.7 g/dL (ref 12.0–15.0)
MCH: 31.9 pg (ref 26.0–34.0)
MCHC: 33.9 g/dL (ref 30.0–36.0)
MCV: 93.9 fL (ref 78.0–100.0)
PLATELETS: 171 10*3/uL (ref 150–400)
RBC: 4.61 MIL/uL (ref 3.87–5.11)
RDW: 14.2 % (ref 11.5–15.5)
WBC: 8.3 10*3/uL (ref 4.0–10.5)

## 2015-10-30 LAB — SURGICAL PCR SCREEN
MRSA, PCR: NEGATIVE
Staphylococcus aureus: POSITIVE — AB

## 2015-10-30 SURGERY — APPENDECTOMY, LAPAROSCOPIC
Anesthesia: General

## 2015-10-30 MED ORDER — ADALIMUMAB 40 MG/0.8ML ~~LOC~~ KIT: 40.0000 mg | PACK | Freq: Once | SUBCUTANEOUS | Status: DC

## 2015-10-30 MED ORDER — ONDANSETRON 4 MG PO TBDP: 4.0000 mg | ORAL_TABLET | Freq: Four times a day (QID) | ORAL | Status: DC | PRN

## 2015-10-30 MED ORDER — BUPIVACAINE-EPINEPHRINE (PF) 0.5% -1:200000 IJ SOLN
INTRAMUSCULAR | Status: AC
  Filled 2015-10-30: qty 30

## 2015-10-30 MED ORDER — ROCURONIUM BROMIDE 100 MG/10ML IV SOLN
INTRAVENOUS | Status: AC
  Filled 2015-10-30: qty 1

## 2015-10-30 MED ORDER — COQ-10 50 MG PO CAPS: ORAL_CAPSULE | Freq: Every day | ORAL | Status: DC

## 2015-10-30 MED ORDER — METRONIDAZOLE IN NACL 5-0.79 MG/ML-% IV SOLN
500.0000 mg | Freq: Three times a day (TID) | INTRAVENOUS | Status: DC
  Administered 2015-10-30 – 2015-10-31 (×3): 500 mg via INTRAVENOUS
  Filled 2015-10-30 (×5): qty 100

## 2015-10-30 MED ORDER — EPHEDRINE SULFATE 50 MG/ML IJ SOLN
INTRAMUSCULAR | Status: DC | PRN
  Administered 2015-10-30: 5 mg via INTRAVENOUS

## 2015-10-30 MED ORDER — FENTANYL CITRATE (PF) 250 MCG/5ML IJ SOLN
INTRAMUSCULAR | Status: AC
  Filled 2015-10-30: qty 25

## 2015-10-30 MED ORDER — TOFACITINIB CITRATE 5 MG PO TABS: 5.0000 mg | ORAL_TABLET | Freq: Two times a day (BID) | ORAL | Status: DC

## 2015-10-30 MED ORDER — LATANOPROST 0.005 % OP SOLN
1.0000 [drp] | Freq: Every day | OPHTHALMIC | Status: DC
Start: 2015-10-30 — End: 2015-10-31
  Administered 2015-10-30: 1 [drp] via OPHTHALMIC
  Filled 2015-10-30: qty 2.5

## 2015-10-30 MED ORDER — PANTOPRAZOLE SODIUM 40 MG PO TBEC
40.0000 mg | DELAYED_RELEASE_TABLET | Freq: Every day | ORAL | Status: DC
  Administered 2015-10-31: 40 mg via ORAL
  Filled 2015-10-30: qty 1

## 2015-10-30 MED ORDER — CELECOXIB 200 MG PO CAPS
200.0000 mg | ORAL_CAPSULE | Freq: Two times a day (BID) | ORAL | Status: DC
  Administered 2015-10-30 – 2015-10-31 (×2): 200 mg via ORAL
  Filled 2015-10-30 (×3): qty 1

## 2015-10-30 MED ORDER — LACTATED RINGERS IV SOLN: INTRAVENOUS | Status: DC

## 2015-10-30 MED ORDER — METRONIDAZOLE IN NACL 5-0.79 MG/ML-% IV SOLN
INTRAVENOUS | Status: AC
  Filled 2015-10-30: qty 100

## 2015-10-30 MED ORDER — SODIUM CHLORIDE 0.9 % IJ SOLN
INTRAMUSCULAR | Status: AC
  Filled 2015-10-30: qty 20

## 2015-10-30 MED ORDER — METOCLOPRAMIDE HCL 5 MG/ML IJ SOLN
INTRAMUSCULAR | Status: DC | PRN
  Administered 2015-10-30: 10 mg via INTRAVENOUS

## 2015-10-30 MED ORDER — POTASSIUM CHLORIDE IN NACL 20-0.9 MEQ/L-% IV SOLN
INTRAVENOUS | Status: DC
  Administered 2015-10-30: 100 mL/h via INTRAVENOUS
  Administered 2015-10-30: 11:00:00 via INTRAVENOUS
  Filled 2015-10-30 (×3): qty 1000

## 2015-10-30 MED ORDER — BUPIVACAINE-EPINEPHRINE 0.25% -1:200000 IJ SOLN
INTRAMUSCULAR | Status: DC | PRN
  Administered 2015-10-30: 15 mL

## 2015-10-30 MED ORDER — LACTATED RINGERS IV SOLN
INTRAVENOUS | Status: DC | PRN
  Administered 2015-10-30 (×2): via INTRAVENOUS

## 2015-10-30 MED ORDER — PROPOFOL 10 MG/ML IV BOLUS
INTRAVENOUS | Status: DC | PRN
  Administered 2015-10-30: 150 mg via INTRAVENOUS

## 2015-10-30 MED ORDER — ESMOLOL HCL 100 MG/10ML IV SOLN
INTRAVENOUS | Status: AC
  Filled 2015-10-30: qty 10

## 2015-10-30 MED ORDER — HYDROMORPHONE HCL 1 MG/ML IJ SOLN
0.2500 mg | INTRAMUSCULAR | Status: DC | PRN
  Administered 2015-10-30 (×2): 0.5 mg via INTRAVENOUS

## 2015-10-30 MED ORDER — TRIAMCINOLONE ACETONIDE 0.1 % EX CREA: 1.0000 "application " | TOPICAL_CREAM | Freq: Two times a day (BID) | CUTANEOUS | Status: DC | PRN

## 2015-10-30 MED ORDER — HYDROMORPHONE HCL 1 MG/ML IJ SOLN
1.0000 mg | INTRAMUSCULAR | Status: DC | PRN
  Administered 2015-10-30 (×3): 1 mg via INTRAVENOUS
  Filled 2015-10-30 (×3): qty 1

## 2015-10-30 MED ORDER — CALCIUM CARBONATE 1250 (500 CA) MG PO TABS
1250.0000 mg | ORAL_TABLET | Freq: Two times a day (BID) | ORAL | Status: DC
  Administered 2015-10-31: 1250 mg via ORAL
  Filled 2015-10-30 (×4): qty 1

## 2015-10-30 MED ORDER — FENTANYL CITRATE (PF) 100 MCG/2ML IJ SOLN
INTRAMUSCULAR | Status: DC | PRN
  Administered 2015-10-30: 50 ug via INTRAVENOUS
  Administered 2015-10-30: 100 ug via INTRAVENOUS
  Administered 2015-10-30 (×2): 50 ug via INTRAVENOUS

## 2015-10-30 MED ORDER — MIDAZOLAM HCL 5 MG/5ML IJ SOLN
INTRAMUSCULAR | Status: DC | PRN
  Administered 2015-10-30 (×2): 1 mg via INTRAVENOUS

## 2015-10-30 MED ORDER — MILK THISTLE 140 MG PO CAPS: 1.0000 | ORAL_CAPSULE | Freq: Every day | ORAL | Status: DC

## 2015-10-30 MED ORDER — VITAMIN D3 25 MCG (1000 UNIT) PO TABS
1000.0000 [IU] | ORAL_TABLET | Freq: Every day | ORAL | Status: DC
  Filled 2015-10-30 (×2): qty 1

## 2015-10-30 MED ORDER — HYDROCODONE-ACETAMINOPHEN 5-325 MG PO TABS
1.0000 | ORAL_TABLET | ORAL | Status: DC | PRN
  Filled 2015-10-30: qty 1

## 2015-10-30 MED ORDER — SUGAMMADEX SODIUM 500 MG/5ML IV SOLN
INTRAVENOUS | Status: AC
  Filled 2015-10-30: qty 5

## 2015-10-30 MED ORDER — HYDROMORPHONE HCL 1 MG/ML IJ SOLN: 0.2500 mg | INTRAMUSCULAR | Status: DC | PRN

## 2015-10-30 MED ORDER — 0.9 % SODIUM CHLORIDE (POUR BTL) OPTIME
TOPICAL | Status: DC | PRN
  Administered 2015-10-30: 1000 mL

## 2015-10-30 MED ORDER — EPHEDRINE SULFATE 50 MG/ML IJ SOLN
INTRAMUSCULAR | Status: AC
  Filled 2015-10-30: qty 1

## 2015-10-30 MED ORDER — VITAMIN B-12 100 MCG PO TABS
100.0000 ug | ORAL_TABLET | Freq: Every day | ORAL | Status: DC
  Filled 2015-10-30 (×2): qty 1

## 2015-10-30 MED ORDER — MENTHOL 3 MG MT LOZG
1.0000 | LOZENGE | OROMUCOSAL | Status: DC | PRN
  Filled 2015-10-30: qty 9

## 2015-10-30 MED ORDER — OMEGA-3-ACID ETHYL ESTERS 1 G PO CAPS
1.0000 g | ORAL_CAPSULE | Freq: Every day | ORAL | Status: DC
  Filled 2015-10-30 (×2): qty 1

## 2015-10-30 MED ORDER — ENOXAPARIN SODIUM 40 MG/0.4ML ~~LOC~~ SOLN
40.0000 mg | SUBCUTANEOUS | Status: DC
  Administered 2015-10-31: 40 mg via SUBCUTANEOUS
  Filled 2015-10-30 (×2): qty 0.4

## 2015-10-30 MED ORDER — DEXAMETHASONE SODIUM PHOSPHATE 10 MG/ML IJ SOLN
INTRAMUSCULAR | Status: DC | PRN
  Administered 2015-10-30: 10 mg via INTRAVENOUS

## 2015-10-30 MED ORDER — PANTOPRAZOLE SODIUM 40 MG IV SOLR: 40.0000 mg | Freq: Every day | INTRAVENOUS | Status: DC

## 2015-10-30 MED ORDER — CYCLOBENZAPRINE HCL 10 MG PO TABS: 10.0000 mg | ORAL_TABLET | Freq: Three times a day (TID) | ORAL | Status: DC | PRN

## 2015-10-30 MED ORDER — MIDAZOLAM HCL 2 MG/2ML IJ SOLN
INTRAMUSCULAR | Status: AC
  Filled 2015-10-30: qty 4

## 2015-10-30 MED ORDER — DEXTROSE 5 % IV SOLN
2.0000 g | INTRAVENOUS | Status: DC
  Administered 2015-10-30: 2 g via INTRAVENOUS
  Filled 2015-10-30: qty 2

## 2015-10-30 MED ORDER — BRIMONIDINE TARTRATE 0.15 % OP SOLN
1.0000 [drp] | Freq: Two times a day (BID) | OPHTHALMIC | Status: DC
  Administered 2015-10-30 – 2015-10-31 (×2): 1 [drp] via OPHTHALMIC
  Filled 2015-10-30: qty 5

## 2015-10-30 MED ORDER — POLYETHYLENE GLYCOL 3350 17 G PO PACK: 17.0000 g | PACK | Freq: Every day | ORAL | Status: DC | PRN

## 2015-10-30 MED ORDER — HYDROCHLOROTHIAZIDE 25 MG PO TABS
25.0000 mg | ORAL_TABLET | Freq: Every day | ORAL | Status: DC
  Administered 2015-10-31: 25 mg via ORAL
  Filled 2015-10-30 (×2): qty 1

## 2015-10-30 MED ORDER — SUGAMMADEX SODIUM 500 MG/5ML IV SOLN
INTRAVENOUS | Status: DC | PRN
  Administered 2015-10-30: 400 mg via INTRAVENOUS

## 2015-10-30 MED ORDER — SUCCINYLCHOLINE CHLORIDE 20 MG/ML IJ SOLN
INTRAMUSCULAR | Status: DC | PRN
  Administered 2015-10-30: 100 mg via INTRAVENOUS

## 2015-10-30 MED ORDER — HYDROMORPHONE HCL 1 MG/ML IJ SOLN
INTRAMUSCULAR | Status: AC
  Filled 2015-10-30: qty 1

## 2015-10-30 MED ORDER — MAGNESIUM OXIDE 400 (241.3 MG) MG PO TABS
200.0000 mg | ORAL_TABLET | Freq: Every day | ORAL | Status: DC
  Filled 2015-10-30 (×2): qty 0.5

## 2015-10-30 MED ORDER — LIDOCAINE HCL (CARDIAC) 20 MG/ML IV SOLN
INTRAVENOUS | Status: AC
  Filled 2015-10-30: qty 5

## 2015-10-30 MED ORDER — LACTATED RINGERS IR SOLN
Status: DC | PRN
Start: 2015-10-30 — End: 2015-10-30
  Administered 2015-10-30: 1

## 2015-10-30 MED ORDER — PROPOFOL 10 MG/ML IV BOLUS
INTRAVENOUS | Status: AC
  Filled 2015-10-30: qty 20

## 2015-10-30 MED ORDER — LIDOCAINE HCL (CARDIAC) 20 MG/ML IV SOLN
INTRAVENOUS | Status: DC | PRN
  Administered 2015-10-30: 50 mg via INTRAVENOUS

## 2015-10-30 MED ORDER — ROCURONIUM BROMIDE 100 MG/10ML IV SOLN
INTRAVENOUS | Status: DC | PRN
  Administered 2015-10-30: 5 mg via INTRAVENOUS
  Administered 2015-10-30: 25 mg via INTRAVENOUS

## 2015-10-30 MED ORDER — ONDANSETRON HCL 4 MG/2ML IJ SOLN: 4.0000 mg | Freq: Four times a day (QID) | INTRAMUSCULAR | Status: DC | PRN

## 2015-10-30 MED ORDER — ESMOLOL HCL 100 MG/10ML IV SOLN
INTRAVENOUS | Status: DC | PRN
  Administered 2015-10-30 (×2): 10 mg via INTRAVENOUS

## 2015-10-30 SURGICAL SUPPLY — 42 items
APL SKNCLS STERI-STRIP NONHPOA (GAUZE/BANDAGES/DRESSINGS)
APPLIER CLIP ROT 10 11.4 M/L (STAPLE)
APR CLP MED LRG 11.4X10 (STAPLE)
BAG SPEC RTRVL LRG 6X4 10 (ENDOMECHANICALS)
BENZOIN TINCTURE PRP APPL 2/3 (GAUZE/BANDAGES/DRESSINGS) ×1 IMPLANT
CLIP APPLIE ROT 10 11.4 M/L (STAPLE) IMPLANT
COVER SURGICAL LIGHT HANDLE (MISCELLANEOUS) ×2 IMPLANT
CUTTER FLEX LINEAR 45M (STAPLE) ×1 IMPLANT
DECANTER SPIKE VIAL GLASS SM (MISCELLANEOUS) ×1 IMPLANT
DRAPE LAPAROSCOPIC ABDOMINAL (DRAPES) ×2 IMPLANT
ELECT PENCIL ROCKER SW 15FT (MISCELLANEOUS) IMPLANT
ELECT REM PT RETURN 9FT ADLT (ELECTROSURGICAL) ×2
ELECTRODE REM PT RTRN 9FT ADLT (ELECTROSURGICAL) ×1 IMPLANT
ENDOLOOP SUT PDS II  0 18 (SUTURE)
ENDOLOOP SUT PDS II 0 18 (SUTURE) IMPLANT
GLOVE BIOGEL PI IND STRL 7.0 (GLOVE) ×1 IMPLANT
GLOVE BIOGEL PI INDICATOR 7.0 (GLOVE) ×4
GLOVE SURG ORTHO 8.0 STRL STRW (GLOVE) ×1 IMPLANT
GOWN STRL REUS W/TWL LRG LVL3 (GOWN DISPOSABLE) ×1 IMPLANT
GOWN STRL REUS W/TWL XL LVL3 (GOWN DISPOSABLE) ×5 IMPLANT
KIT BASIN OR (CUSTOM PROCEDURE TRAY) ×2 IMPLANT
POUCH SPECIMEN RETRIEVAL 10MM (ENDOMECHANICALS) IMPLANT
RELOAD 45 VASCULAR/THIN (ENDOMECHANICALS) IMPLANT
RELOAD STAPLE 45 2.5 WHT GRN (ENDOMECHANICALS) IMPLANT
RELOAD STAPLE 45 3.5 BLU ETS (ENDOMECHANICALS) IMPLANT
RELOAD STAPLE TA45 3.5 REG BLU (ENDOMECHANICALS) ×2 IMPLANT
SET IRRIG TUBING LAPAROSCOPIC (IRRIGATION / IRRIGATOR) ×1 IMPLANT
SHEARS HARMONIC ACE PLUS 36CM (ENDOMECHANICALS) ×1 IMPLANT
SOLUTION ANTI FOG 6CC (MISCELLANEOUS) ×2 IMPLANT
STRIP CLOSURE SKIN 1/2X4 (GAUZE/BANDAGES/DRESSINGS) ×2 IMPLANT
SUT MNCRL AB 4-0 PS2 18 (SUTURE) ×2 IMPLANT
TOWEL OR 17X26 10 PK STRL BLUE (TOWEL DISPOSABLE) ×2 IMPLANT
TRAY FOLEY W/METER SILVER 14FR (SET/KITS/TRAYS/PACK) ×1 IMPLANT
TRAY FOLEY W/METER SILVER 16FR (SET/KITS/TRAYS/PACK) ×1 IMPLANT
TRAY LAPAROSCOPIC (CUSTOM PROCEDURE TRAY) ×2 IMPLANT
TRAY PREP A LATEX SAFE STRL (SET/KITS/TRAYS/PACK) ×1 IMPLANT
TROCAR BLADELESS OPT 5 75 (ENDOMECHANICALS) ×2 IMPLANT
TROCAR XCEL 12X100 BLDLESS (ENDOMECHANICALS) ×1 IMPLANT
TROCAR XCEL BLUNT TIP 100MML (ENDOMECHANICALS) ×2 IMPLANT
TROCAR XCEL NON-BLD 11X100MML (ENDOMECHANICALS) ×2 IMPLANT
TUBING INSUFFLATION 10FT LAP (TUBING) ×2 IMPLANT
WATER STERILE IRR 1500ML POUR (IV SOLUTION) ×1 IMPLANT

## 2015-10-30 NOTE — Anesthesia Procedure Notes (Signed)
Procedure Name: Intubation Date/Time: 10/30/2015 7:49 AM Performed by: Claryce Friel, Nuala Alpha Pre-anesthesia Checklist: Patient identified, Emergency Drugs available, Suction available, Patient being monitored and Timeout performed Patient Re-evaluated:Patient Re-evaluated prior to inductionOxygen Delivery Method: Circle system utilized Preoxygenation: Pre-oxygenation with 100% oxygen Intubation Type: IV induction Ventilation: Mask ventilation without difficulty Laryngoscope Size: Mac and 4 Grade View: Grade III Tube type: Oral Tube size: 7.0 mm Number of attempts: 1 Airway Equipment and Method: Stylet Placement Confirmation: ETT inserted through vocal cords under direct vision,  positive ETCO2,  CO2 detector and breath sounds checked- equal and bilateral Secured at: 21 cm Tube secured with: Tape Dental Injury: Teeth and Oropharynx as per pre-operative assessment  Difficulty Due To: Difficult Airway- due to anterior larynx Future Recommendations: Recommend- induction with short-acting agent, and alternative techniques readily available Comments: Easy mask.  Easy pass off ettube blindly on first attempt.

## 2015-10-30 NOTE — Progress Notes (Addendum)
Gen. surgery attending note:  History reviewed.  Patient examined.  Imaging studies reviewed. History, physical findings, and CT scan consistent with acute appendicitis, but no evidence of abscess or rupture at this time. On exam she has tenderness in the right lower quadrant and suprapubic area, but no evidence of diffuse peritonitis Currently on Unasyn and Rocephin.  Past history significant for 2 Syrian sections through lower midline incision.  Retarded arthritis, hypertension.  She does not take steroids.  Assessment/plan: Acute appendicitis.  Plan laparoscopic appendectomy, possible open appendectomy this morning I discussed the indications, details, techniques, and numerous risk of the surgery with her.  She's aware of the risk of bleeding, infection, conversion to open laparotomy, wound hernia, injury to adjacent organs with major reconstructive surgery, and other unforeseen problems..  She understands all these issues and all of her questions are answered.  She agrees with this plan.  Angelia Mould. Derrell Lolling, M.D., [facility] General and Minimally invasive Surgery Breast and Colorectal Surgery

## 2015-10-30 NOTE — Op Note (Signed)
Patient Name:           Sharon Bradshaw   Date of Surgery:        10/30/2015  Pre op Diagnosis:      Acute appendicitis  Post op Diagnosis:    Acute suppurative appendicitis, intra-abdominal adhesions  Procedure:                 Laparoscopic lysis of adhesions, laparoscopic appendectomy  Surgeon:                     Angelia Mould. Derrell Lolling, M.D., FACS  Assistant:                      Or staff  Operative Indications:   This is a 59 year old female with a history of 2 cesarean sections in the past.  Rheumatoid arthritis.  Also vascular disease.  Hypertension.  Spinal stenosis.  She presented with a 48 hour history of crampy abdominal pain nausea sweats and chills and low-grade fever.  Examination reveals tenderness in the right lower quadrant and suprapubic area.  CT scan shows evidence of acute appendicitis with an appendicolith but no sign of perforation.  She was admitted last night by Dr. Bard Herbert started on antibiotics.  She is brought to the operating room this morning for appendectomy  Operative Findings:       There were moderately extensive omental adhesions to the anterior midline which had to be taken down carefully to obtain proper visualization.  This took almost 30 minutes.  The appendix was very thickened as was the appendiceal mesentery.  The terminal ileum was adherent to the appendix but we were able to gently and bluntly dissect them apart.  There was exudate on the appendix but there is no evidence of perforation.  The liver, gallbladder, small bowel, large bowel were otherwise grossly normal to inspection.  Procedure in Detail:          Following the induction of general endotracheal anesthesia, intravenous antibodies were given.  Surgical timeout was performed.  Foley catheter was placed.  The abdomen was prepped and draped in a sterile fashion.  0.5% Marcaine with epinephrine was used as a local infiltration anesthetic.      A vertical incision was made in the upper rim of the  umbilicus.  The fascia was incised in the midline and the abdominal cavity entered under direct vision.  A Hassan trocar was inserted and secured with the Purstring suture of 0 Vicryl.  Pneumoperitoneum was created.  Video camera was inserted.  A 12 mm trochar was placed in the left suprapubic area and a 5 mm trocar placed in the left midabdomen.  I spent some time taking down the omental adhesions to the anterior abdominal wall.  Changed camera angle several times for visualization.  The omental adhesions were eventually completely taken down and there is no evidence of any intestinal involvement.  We mobilized small bowel up out of the pelvis.  We identified the appendix and elevated that.  We slowly teased the terminal ileum off of the appendix and then we can see the mesentery of the appendix and the anatomy.  We divided the appendiceal mesentery in several small steps.  We had a couple of arterial bleeders that were nicely controlled with harmonic scalpel.  We ultimately were able to dissected all the way down to the base of the appendix and visualize its insertion into the cecum.  GIA stapling device with 3.5 mm  staple load was inserted and placed across the base of the appendix and taking a little bit of the cecal wall.  Staples closed fired and removed and the appendix was placed in a specimen bag and removed.  The operative field was copiously irrigated.  Hemostasis was very good.  Staple line looked good.  We brought the omentum down and placed it over the top of the areas of dissection.  We evacuated all the irrigation fluid.  4 quadrant inspection was negative otherwise.  The pneumoperitoneum was released and the trochars were removed.  The fascia at the umbilicus was closed with 0 Vicryl sutures.  The skin incisions were closed with subcuticular 4-0 Monocryl and Dermabond.  Patient tolerated the procedure well was taken to PACU in stable condition.  EBL 20 mL.  Counts correct.  Complications  none.     Angelia Mould. Derrell Lolling, M.D., FACS General and Minimally Invasive Surgery Breast and Colorectal Surgery  10/30/2015 9:07 AM

## 2015-10-30 NOTE — Anesthesia Postprocedure Evaluation (Signed)
  Anesthesia Post-op Note  Patient: Sharon Bradshaw  Procedure(s) Performed: Procedure(s) (LRB): APPENDECTOMY LAPAROSCOPIC (N/A)  Patient Location: PACU  Anesthesia Type: General  Level of Consciousness: awake and alert   Airway and Oxygen Therapy: Patient Spontanous Breathing  Post-op Pain: mild  Post-op Assessment: Post-op Vital signs reviewed, Patient's Cardiovascular Status Stable, Respiratory Function Stable, Patent Airway and No signs of Nausea or vomiting  Last Vitals:  Filed Vitals:   10/30/15 0916  BP: 142/77  Pulse: 103  Temp: 36.7 C  Resp: 19    Post-op Vital Signs: stable   Complications: No apparent anesthesia complications

## 2015-10-30 NOTE — Transfer of Care (Signed)
Immediate Anesthesia Transfer of Care Note  Patient: Sharon Bradshaw  Procedure(s) Performed: Procedure(s): APPENDECTOMY LAPAROSCOPIC (N/A)  Patient Location: PACU  Anesthesia Type:General  Level of Consciousness:  sedated, patient cooperative and responds to stimulation  Airway & Oxygen Therapy:Patient Spontanous Breathing and Patient connected to face mask oxgen  Post-op Assessment:  Report given to PACU RN and Post -op Vital signs reviewed and stable  Post vital signs:  Reviewed and stable  Last Vitals:  Filed Vitals:   10/30/15 0601  BP: 143/82  Pulse: 100  Temp: 37.7 C  Resp: 18    Complications: No apparent anesthesia complications

## 2015-10-31 MED ORDER — TRAMADOL HCL 50 MG PO TABS
50.0000 mg | ORAL_TABLET | Freq: Four times a day (QID) | ORAL | Status: DC | PRN
  Administered 2015-10-31: 100 mg via ORAL
  Filled 2015-10-31: qty 2

## 2015-10-31 MED ORDER — TRAMADOL HCL 50 MG PO TABS: 50.0000 mg | ORAL_TABLET | Freq: Four times a day (QID) | ORAL | Status: DC | PRN

## 2015-10-31 MED ORDER — HYDROCODONE-ACETAMINOPHEN 5-325 MG PO TABS: 1.0000 | ORAL_TABLET | ORAL | Status: DC | PRN

## 2015-10-31 NOTE — Discharge Summary (Signed)
Physician Discharge Summary  Patient ID: Sharon Bradshaw MRN: [id number] DOB/AGE: 59-10-1956 58 y.o.  Admit date: 10/29/2015 Discharge date: 10/31/2015  Admission Diagnoses:  Acute appendicitis Hypertension Rheumatoid arthritis Collagen vascular disease Hx of spinal stenosis  Discharge Diagnoses:  Acute appendicitis Hypertension Rheumatoid arthritis Collagen vascular disease Hx of spinal stenosis   Principal Problem:   Acute appendicitis Active Problems:   Suppurative appendicitis   PROCEDURES:  Laparoscopic lysis of adhesions, laparoscopic appendectomy, 10/30/15, Angelia Mould. Derrell Lolling, M.D  Hospital Course:  59 yo female with 2 day hx of crampy abd pain, nausea, sweats, and chills. Low grade fever. Presented to primary MD at [facility], referred to [facility] for CT scan. Scan shows evidence acute appendicitis with an appendicolith. No sign of perforation. IV abx started in the [facility] ED.  She had surgery the following AM, by Dr. Derrell Lolling.  She was doing well the following AM, and was ready for discharge.   CBC Latest Ref Rng 10/30/2015 02/21/2011 09/08/2008  WBC 4.0 - 10.5 K/uL 8.3 5.7 -  Hemoglobin 12.0 - 15.0 g/dL 17.5 10.2 58.5  Hematocrit 36.0 - 46.0 % 43.3 41.4 41.4  Platelets 150 - 400 K/uL 171 181 -   CMP Latest Ref Rng 10/30/2015 10/29/2015 02/21/2011  Glucose 65 - 99 mg/dL - 277(O) 242(P)  BUN 6 - 20 mg/dL - 14 19  Creatinine 5.36 - 1.00 mg/dL 1.44 3.15 .6  Sodium 400 - 145 mmol/L - 132(L) 145  Potassium 3.5 - 5.1 mmol/L - 3.4(L) 4.2  Chloride 101 - 111 mmol/L - 96(L) 102  CO2 22 - 32 mmol/L - 25 31  Calcium 8.9 - 10.3 mg/dL - 9.3 86.7  Total Protein 6.5 - 8.1 g/dL - 8.7(H) 9.1(H)  Total Bilirubin 0.3 - 1.2 mg/dL - 1.0 0.5  Alkaline Phos 38 - 126 U/L - 73 88  AST 15 - 41 U/L - 30 30  ALT 14 - 54 U/L - 32 30    Condition on d/c:  Improved      Disposition: 01-Home or Self Care     Medication List    TAKE these medications        brimonidine 0.1 %  Soln  Commonly known as:  ALPHAGAN P  Place 1 drop into both eyes 2 (two) times daily.     calcium carbonate 600 MG Tabs tablet  Commonly known as:  OS-CAL  Take 600 mg by mouth 2 (two) times daily with a meal.     celecoxib 200 MG capsule  Commonly known as:  CELEBREX  Take 200 mg by mouth 2 (two) times daily.     cholecalciferol 1000 UNITS tablet  Commonly known as:  VITAMIN D  Take 1,000 Units by mouth daily.     COQ-10 PO  Take 1 capsule by mouth daily.     cyclobenzaprine 10 MG tablet  Commonly known as:  FLEXERIL  Take 10 mg by mouth 3 (three) times daily as needed for muscle spasms.     Fish Oil 1000 MG Caps  Take 1 capsule by mouth daily.     HUMIRA 40 MG/0.8ML injection  Generic drug:  adalimumab  Inject 40 mg into the skin once.     hydrochlorothiazide 25 MG tablet  Commonly known as:  HYDRODIURIL  Take 25 mg by mouth daily.     lansoprazole 30 MG capsule  Commonly known as:  PREVACID  Take 30 mg by mouth daily at 12 noon.     latanoprost 0.005 %  ophthalmic solution  Commonly known as:  XALATAN  Place 1 drop into both eyes at bedtime.     MAGNESIUM-OXIDE PO  Take 1 tablet by mouth daily.     Milk Thistle 140 MG Caps  Take 1 capsule by mouth daily.     traMADol 50 MG tablet  Commonly known as:  ULTRAM  Take 1-2 tablets (50-100 mg total) by mouth every 6 (six) hours as needed for moderate pain.     triamcinolone cream 0.1 %  Commonly known as:  KENALOG  Apply 1 application topically 2 (two) times daily as needed (ezcema).     VITAMIN B-12 PO  Take 1 tablet by mouth daily.     XELJANZ 5 MG Tabs  Generic drug:  Tofacitinib Citrate  Take by mouth 2 (two) times daily.       Follow-up Information    Follow up with [facility] On 11/14/2015.   Specialty:  General Surgery   Why:  Your appointment is at 10:45AM, be there 30 minutes early for check in.   Contact information:   [address] [place]  [address] [phone]       Schedule an appointment as soon as possible for a visit with Joycelyn Rua, MD.   Specialty:  Family Medicine   Why:  1-2 weeks and before you take your next dose of Humira.   Contact information:   [address] [place] [address] [phone]       Signed: Sherrie George 10/31/2015, 8:53 AM

## 2015-10-31 NOTE — Discharge Instructions (Signed)
Laparoscopic Appendectomy, Adult, Care After °Refer to this sheet in the next few weeks. These instructions provide you with information on caring for yourself after your procedure. Your caregiver may also give you more specific instructions. Your treatment has been planned according to current medical practices, but problems sometimes occur. Call your caregiver if you have any problems or questions after your procedure. °HOME CARE INSTRUCTIONS °· Do not drive while taking narcotic pain medicines. °· Use stool softener if you become constipated from your pain medicines. °· Change your bandages (dressings) as directed. °· Keep your wounds clean and dry. You may wash the wounds gently with soap and water. Gently pat the wounds dry with a clean towel. °· Do not take baths, swim, or use hot tubs for 10 days, or as instructed by your caregiver. °· Only take over-the-counter or prescription medicines for pain, discomfort, or fever as directed by your caregiver. °· You may continue your normal diet as directed. °· Do not lift more than 10 pounds (4.5 kg) or play contact sports for 3 weeks, or as directed. °· Slowly increase your activity after surgery. °· Take deep breaths to avoid getting a lung infection (pneumonia). °SEEK MEDICAL CARE IF: °· You have redness, swelling, or increasing pain in your wounds. °· You have pus coming from your wounds. °· You have drainage from a wound that lasts longer than 1 day. °· You notice a bad smell coming from the wounds or dressing. °· Your wound edges break open after stitches (sutures) have been removed. °· You notice increasing pain in the shoulders (shoulder strap areas) or near your shoulder blades. °· You develop dizzy episodes or fainting while standing. °· You develop shortness of breath. °· You develop persistent nausea or vomiting. °· You cannot control your bowel functions or lose your appetite. °· You develop diarrhea. °SEEK IMMEDIATE MEDICAL CARE IF:  °· You have a  fever. °· You develop a rash. °· You have difficulty breathing or sharp pains in your chest. °· You develop any reaction or side effects to medicines given. °MAKE SURE YOU: °· Understand these instructions. °· Will watch your condition. °· Will get help right away if you are not doing well or get worse. °  °This information is not intended to replace advice given to you by your health care provider. Make sure you discuss any questions you have with your health care provider. °  °Document Released: 12/15/2005 Document Revised: 05/01/2015 Document Reviewed: 06/04/2015 °Elsevier Interactive Patient Education ©2016 [employer] ° °CCS ______[facility] °LAPAROSCOPIC SURGERY: POST OP INSTRUCTIONS °Always review your discharge instruction sheet given to you by the facility where your surgery was performed. °IF YOU HAVE DISABILITY OR FAMILY LEAVE FORMS, YOU MUST BRING THEM TO THE OFFICE FOR PROCESSING.   °DO NOT GIVE THEM TO YOUR DOCTOR. ° °1. A prescription for pain medication may be given to you upon discharge.  Take your pain medication as prescribed, if needed.  If narcotic pain medicine is not needed, then you may take acetaminophen (Tylenol) or ibuprofen (Advil) as needed. °2. Take your usually prescribed medications unless otherwise directed. °3. If you need a refill on your pain medication, please contact your pharmacy.  They will contact our office to request authorization. Prescriptions will not be filled after 5pm or on week-ends. °4. You should follow a light diet the first few days after arrival home, such as soup and crackers, etc.  Be sure to include lots of fluids daily. °5. Most   patients will experience some swelling and bruising in the area of the incisions.  Ice packs will help.  Swelling and bruising can take several days to resolve.  6. It is common to experience some constipation if taking pain medication after surgery.  Increasing fluid intake and taking a stool softener (such as  Colace) will usually help or prevent this problem from occurring.  A mild laxative (Milk of Magnesia or Miralax) should be taken according to package instructions if there are no bowel movements after 48 hours. 7. Unless discharge instructions indicate otherwise, you may remove your bandages 24-48 hours after surgery, and you may shower at that time.  You may have steri-strips (small skin tapes) in place directly over the incision.  These strips should be left on the skin for 7-10 days.  If your surgeon used skin glue on the incision, you may shower in 24 hours.  The glue will flake off over the next 2-3 weeks.  Any sutures or staples will be removed at the office during your follow-up visit. 8. ACTIVITIES:  You may resume regular (light) daily activities beginning the next day--such as daily self-care, walking, climbing stairs--gradually increasing activities as tolerated.  You may have sexual intercourse when it is comfortable.  Refrain from any heavy lifting or straining until approved by your doctor. a. You may drive when you are no longer taking prescription pain medication, you can comfortably wear a seatbelt, and you can safely maneuver your car and apply brakes. b. RETURN TO WORK:  __________________________________________________________ 9. You should see your doctor in the office for a follow-up appointment approximately 2-3 weeks after your surgery.  Make sure that you call for this appointment within a day or two after you arrive home to insure a convenient appointment time. 10. OTHER INSTRUCTIONS: __________________________________________________________________________________________________________________________ __________________________________________________________________________________________________________________________ WHEN TO CALL YOUR DOCTOR: 1. Fever over 101.0 2. Inability to urinate 3. Continued bleeding from incision. 4. Increased pain, redness, or drainage from the  incision. 5. Increasing abdominal pain  The clinic staff is available to answer your questions during regular business hours.  Please dont hesitate to call and ask to speak to one of the nurses for clinical concerns.  If you have a medical emergency, go to the nearest emergency room or call 911.  A surgeon from [facility] is always on call at the hospital. [address], [place], [place]  [address] ? [address] [address], [place], [place]   [address] [phone] ? [phone] ? FAX [phone] Web site: [email or web address] ______

## 2015-10-31 NOTE — Progress Notes (Addendum)
1 Day Post-Op  Subjective: Alert and stable.  Feels much better.  Denies nausea.  Has ambulated.  Feels ready to eat. Temperature 100.4 yesterday.  Has been afebrile since.  Heart rate 73.  Voiding without difficulty. Operative findings discussed with patient.  Objective: Vital signs in last 24 hours: Temp:  [98 F (36.7 C)-100.4 F (38 C)] 98.2 F (36.8 C) (11/02 0555) Pulse Rate:  [73-103] 73 (11/02 0555) Resp:  [11-19] 18 (11/02 0555) BP: (109-144)/(55-88) 140/79 mmHg (11/02 0555) SpO2:  [88 %-99 %] 97 % (11/02 0555) Last BM Date: 10/28/15  Intake/Output from previous day: 11/01 0701 - 11/02 0700 In: 3150 [I.V.:3150] Out: 2030 [Urine:1980; Blood:50] Intake/Output this shift: Total I/O In: 1100 [I.V.:1100] Out: 1180 [Urine:1180]  General appearance: Alert.  In no distress.  Cooperative.  Mental status normal.  Good spirits. Resp: clear to auscultation bilaterally GI: Soft.  Minimal tenderness.  Not distended.  Trocar sites look good with small ecchymoses.  Lab Results:  Results for orders placed or performed during the hospital encounter of 10/29/15 (from the past 24 hour(s))  CBC     Status: None   Collection Time: 10/30/15  6:27 AM  Result Value Ref Range   WBC 8.3 4.0 - 10.5 K/uL   RBC 4.61 3.87 - 5.11 MIL/uL   Hemoglobin 14.7 12.0 - 15.0 g/dL   HCT 84.6 65.9 - 93.5 %   MCV 93.9 78.0 - 100.0 fL   MCH 31.9 26.0 - 34.0 pg   MCHC 33.9 30.0 - 36.0 g/dL   RDW 70.1 77.9 - 39.0 %   Platelets 171 150 - 400 K/uL  Creatinine, serum     Status: None   Collection Time: 10/30/15  6:27 AM  Result Value Ref Range   Creatinine, Ser 0.67 0.44 - 1.00 mg/dL   GFR calc non Af Amer >60 >60 mL/min   GFR calc Af Amer >60 >60 mL/min     Studies/Results: No results found.  . brimonidine  1 drop Both Eyes BID  . calcium carbonate  1,250 mg Oral BID WC  . cefTRIAXone (ROCEPHIN)  IV  2 g Intravenous Q24H   And  . metronidazole  500 mg Intravenous Q8H  . celecoxib  200 mg Oral  BID  . cholecalciferol  1,000 Units Oral Daily  . enoxaparin (LOVENOX) injection  40 mg Subcutaneous Q24H  . hydrochlorothiazide  25 mg Oral Daily  . latanoprost  1 drop Both Eyes QHS  . magnesium oxide  200 mg Oral Daily  . omega-3 acid ethyl esters  1 g Oral Daily  . pantoprazole  40 mg Oral Daily  . Tofacitinib Citrate  5 mg Oral BID  . vitamin B-12  100 mcg Oral Daily     Assessment/Plan: s/p Procedure(s): APPENDECTOMY LAPAROSCOPIC  POD #1.  Laparoscopic appendectomy for acute suppurative appendicitis Advance diet and activities Probably home later today. Follow-up in clinic in 3 weeks. Discontinue antibiotics after 1 postop days.  @PROBHOSP @  LOS: 1 day    Brannon Levene M 10/31/2015  . .prob

## 2016-02-25 ENCOUNTER — Other Ambulatory Visit: Payer: Self-pay | Admitting: Rheumatology

## 2016-02-25 DIAGNOSIS — M0589 Other rheumatoid arthritis with rheumatoid factor of multiple sites: Secondary | ICD-10-CM

## 2016-03-07 ENCOUNTER — Ambulatory Visit
Admission: RE | Admit: 2016-03-07 | Discharge: 2016-03-07 | Disposition: A | Discharge status: Home / Self Care | Payer: Commercial Managed Care - HMO | Source: Ambulatory Visit | Attending: Rheumatology | Admitting: Rheumatology

## 2016-03-07 DIAGNOSIS — M0589 Other rheumatoid arthritis with rheumatoid factor of multiple sites: Secondary | ICD-10-CM

## 2016-08-19 ENCOUNTER — Other Ambulatory Visit: Payer: Self-pay | Admitting: Obstetrics and Gynecology

## 2016-08-19 DIAGNOSIS — Z1231 Encounter for screening mammogram for malignant neoplasm of breast: Secondary | ICD-10-CM

## 2016-10-06 ENCOUNTER — Other Ambulatory Visit: Payer: Self-pay | Admitting: Obstetrics and Gynecology

## 2016-10-06 ENCOUNTER — Ambulatory Visit
Admission: RE | Admit: 2016-10-06 | Discharge: 2016-10-06 | Disposition: A | Discharge status: Home / Self Care | Payer: Commercial Managed Care - HMO | Source: Ambulatory Visit | Attending: Obstetrics and Gynecology | Admitting: Obstetrics and Gynecology

## 2016-10-06 ENCOUNTER — Other Ambulatory Visit (HOSPITAL_COMMUNITY)
Admission: RE | Admit: 2016-10-06 | Discharge: 2016-10-06 | Disposition: A | Discharge status: Home / Self Care | Payer: Commercial Managed Care - HMO | Source: Ambulatory Visit | Attending: Obstetrics and Gynecology | Admitting: Obstetrics and Gynecology

## 2016-10-06 DIAGNOSIS — Z01419 Encounter for gynecological examination (general) (routine) without abnormal findings: Secondary | ICD-10-CM | POA: Insufficient documentation

## 2016-10-06 DIAGNOSIS — Z1231 Encounter for screening mammogram for malignant neoplasm of breast: Secondary | ICD-10-CM

## 2016-10-07 LAB — CYTOLOGY - PAP

## 2017-04-01 ENCOUNTER — Other Ambulatory Visit: Payer: Self-pay | Admitting: Neurosurgery

## 2017-04-01 DIAGNOSIS — M4316 Spondylolisthesis, lumbar region: Secondary | ICD-10-CM

## 2017-04-06 ENCOUNTER — Other Ambulatory Visit: Payer: Self-pay | Admitting: Neurosurgery

## 2017-04-06 DIAGNOSIS — M4316 Spondylolisthesis, lumbar region: Secondary | ICD-10-CM

## 2017-04-21 ENCOUNTER — Ambulatory Visit
Admission: RE | Admit: 2017-04-21 | Discharge: 2017-04-21 | Disposition: A | Discharge status: Home / Self Care | Payer: Commercial Managed Care - HMO | Source: Ambulatory Visit | Attending: Neurosurgery | Admitting: Neurosurgery

## 2017-04-21 DIAGNOSIS — M4316 Spondylolisthesis, lumbar region: Secondary | ICD-10-CM

## 2017-09-15 ENCOUNTER — Other Ambulatory Visit: Payer: Self-pay | Admitting: Obstetrics and Gynecology

## 2017-09-15 DIAGNOSIS — Z1239 Encounter for other screening for malignant neoplasm of breast: Secondary | ICD-10-CM

## 2017-11-17 ENCOUNTER — Other Ambulatory Visit: Payer: Self-pay | Admitting: Obstetrics and Gynecology

## 2017-11-17 ENCOUNTER — Ambulatory Visit
Admission: RE | Admit: 2017-11-17 | Discharge: 2017-11-17 | Disposition: A | Discharge status: Home / Self Care | Payer: Commercial Managed Care - HMO | Source: Ambulatory Visit | Attending: Obstetrics and Gynecology | Admitting: Obstetrics and Gynecology

## 2017-11-17 ENCOUNTER — Other Ambulatory Visit (HOSPITAL_COMMUNITY)
Admission: RE | Admit: 2017-11-17 | Discharge: 2017-11-17 | Disposition: A | Discharge status: Home / Self Care | Payer: Commercial Managed Care - HMO | Source: Ambulatory Visit | Attending: Obstetrics and Gynecology | Admitting: Obstetrics and Gynecology

## 2017-11-17 DIAGNOSIS — Z1239 Encounter for other screening for malignant neoplasm of breast: Secondary | ICD-10-CM

## 2017-11-17 DIAGNOSIS — Z01419 Encounter for gynecological examination (general) (routine) without abnormal findings: Secondary | ICD-10-CM | POA: Diagnosis present

## 2017-11-18 LAB — CYTOLOGY - PAP: Diagnosis: NEGATIVE

## 2018-09-17 ENCOUNTER — Other Ambulatory Visit: Payer: Self-pay | Admitting: Obstetrics and Gynecology

## 2018-09-17 DIAGNOSIS — Z1231 Encounter for screening mammogram for malignant neoplasm of breast: Secondary | ICD-10-CM

## 2018-12-06 ENCOUNTER — Ambulatory Visit: Payer: Commercial Managed Care - HMO

## 2019-01-26 ENCOUNTER — Other Ambulatory Visit: Payer: Self-pay | Admitting: Obstetrics and Gynecology

## 2019-01-26 ENCOUNTER — Ambulatory Visit
Admission: RE | Admit: 2019-01-26 | Discharge: 2019-01-26 | Disposition: A | Discharge status: Home / Self Care | Payer: 59 | Source: Ambulatory Visit | Attending: Obstetrics and Gynecology | Admitting: Obstetrics and Gynecology

## 2019-01-26 ENCOUNTER — Other Ambulatory Visit (HOSPITAL_COMMUNITY)
Admission: RE | Admit: 2019-01-26 | Discharge: 2019-01-26 | Disposition: A | Discharge status: Home / Self Care | Payer: 59 | Source: Ambulatory Visit | Attending: Obstetrics and Gynecology | Admitting: Obstetrics and Gynecology

## 2019-01-26 DIAGNOSIS — Z1231 Encounter for screening mammogram for malignant neoplasm of breast: Secondary | ICD-10-CM

## 2019-01-26 DIAGNOSIS — Z01419 Encounter for gynecological examination (general) (routine) without abnormal findings: Secondary | ICD-10-CM | POA: Diagnosis present

## 2019-01-31 LAB — CYTOLOGY - PAP
DIAGNOSIS: NEGATIVE
HPV: NOT DETECTED

## 2019-12-19 ENCOUNTER — Other Ambulatory Visit: Payer: Self-pay | Admitting: Obstetrics and Gynecology

## 2019-12-19 DIAGNOSIS — Z1231 Encounter for screening mammogram for malignant neoplasm of breast: Secondary | ICD-10-CM

## 2020-02-06 ENCOUNTER — Other Ambulatory Visit: Payer: Self-pay

## 2020-02-06 ENCOUNTER — Ambulatory Visit
Admission: RE | Admit: 2020-02-06 | Discharge: 2020-02-06 | Disposition: A | Discharge status: Home / Self Care | Payer: 59 | Source: Ambulatory Visit | Attending: Obstetrics and Gynecology | Admitting: Obstetrics and Gynecology

## 2020-02-06 DIAGNOSIS — Z1231 Encounter for screening mammogram for malignant neoplasm of breast: Secondary | ICD-10-CM

## 2021-02-25 ENCOUNTER — Other Ambulatory Visit: Payer: Self-pay | Admitting: Obstetrics and Gynecology

## 2021-02-25 DIAGNOSIS — Z1231 Encounter for screening mammogram for malignant neoplasm of breast: Secondary | ICD-10-CM

## 2021-04-17 ENCOUNTER — Ambulatory Visit: Payer: 59

## 2021-04-22 ENCOUNTER — Ambulatory Visit: Payer: 59

## 2021-06-07 ENCOUNTER — Ambulatory Visit
Admission: RE | Admit: 2021-06-07 | Discharge: 2021-06-07 | Disposition: A | Discharge status: Home / Self Care | Payer: 59 | Source: Ambulatory Visit | Attending: Obstetrics and Gynecology | Admitting: Obstetrics and Gynecology

## 2021-06-07 ENCOUNTER — Other Ambulatory Visit: Payer: Self-pay

## 2021-06-07 DIAGNOSIS — Z1231 Encounter for screening mammogram for malignant neoplasm of breast: Secondary | ICD-10-CM

## 2021-08-23 ENCOUNTER — Ambulatory Visit (INDEPENDENT_AMBULATORY_CARE_PROVIDER_SITE_OTHER): Payer: 59 | Admitting: Podiatry

## 2021-08-23 ENCOUNTER — Other Ambulatory Visit: Payer: Self-pay

## 2021-08-23 ENCOUNTER — Ambulatory Visit (INDEPENDENT_AMBULATORY_CARE_PROVIDER_SITE_OTHER): Payer: 59

## 2021-08-23 DIAGNOSIS — M778 Other enthesopathies, not elsewhere classified: Secondary | ICD-10-CM

## 2021-08-23 DIAGNOSIS — M79671 Pain in right foot: Secondary | ICD-10-CM

## 2021-08-23 NOTE — Progress Notes (Signed)
Subjective:   Patient ID: Sharon Bradshaw, female   DOB: 65 y.o.   MRN: [id number]   HPI 65 year old female presents to the office for right foot pain which has been ongoing for 1-1.5 months. She states it hurts to walk, apply pressure.  She does have a significant history of rheumatoid arthritis and she was having a flare.  Rheumatologist to do an ankle joint steroid injection which she states does help however she still gets pain in the plantar lateral aspect of right foot.  She also states that she done physical therapy previously for other issues.  No specific injury that she reports.   Review of Systems  All other systems reviewed and are negative.  Past Medical History:  Diagnosis Date   Collagen vascular disease (HCC)    Hypertension    RA (rheumatoid arthritis) (HCC)    Spinal stenosis     Past Surgical History:  Procedure Laterality Date   ANKLE SURGERY  tendon   CESAREAN SECTION     ELBOW ARTHROSCOPY     LAPAROSCOPIC APPENDECTOMY N/A 10/30/2015   Procedure: APPENDECTOMY LAPAROSCOPIC;  Surgeon: Claud Kelp, MD;  Location: [facility];  Service: General;  Laterality: N/A;     Current Outpatient Medications:    albuterol (VENTOLIN HFA) 108 (90 Base) MCG/ACT inhaler, as needed., Disp: , Rfl:    amLODipine (NORVASC) 2.5 MG tablet, 1 tablet, Disp: , Rfl:    celecoxib (CELEBREX) 200 MG capsule, TAKE 1 CAPSULE (200 MG TOTAL) BY MOUTH 2 (TWO) TIMES DAILY., Disp: , Rfl:    clobetasol ointment (TEMOVATE) 0.05 %, , Disp: , Rfl:    diclofenac Sodium (VOLTAREN) 1 % GEL, Apply topically., Disp: , Rfl:    EPINEPHrine (EPIPEN 2-PAK) 0.3 mg/0.3 mL IJ SOAJ injection, 1 injection, Disp: , Rfl:    estradiol (ESTRACE) 0.1 MG/GM vaginal cream, APPLY 1 GRAM VAGINALLY AT  NIGHT FOR 2 WEEKS AND THEN  1 GRAM EVERY OTHER AT NIGHT THEREAFTER, Disp: , Rfl:    hydrocortisone 2.5 % cream, if needed each day., Disp: , Rfl:    hydroxychloroquine (PLAQUENIL) 200 MG tablet, Take by mouth., Disp: , Rfl:     lidocaine (LIDODERM) 5 %, APPLY 1 TO 3 PATCHES  TOPICALLY TO INTACT SKIN AS NEEDED REMOVE AFTER 12  HOURS ONCE DAILY, Disp: , Rfl:    metoprolol succinate (TOPROL-XL) 25 MG 24 hr tablet, Take by mouth., Disp: , Rfl:    Olopatadine HCl (PAZEO) 0.7 % SOLN, , Disp: , Rfl:    triamcinolone cream (KENALOG) 0.1 %, APPLY TO AFFECTED AREA TOPICAL 2(TWO) TIMES A DAY, Disp: , Rfl:    abatacept (ORENCIA) 250 MG injection, Inject into the vein., Disp: , Rfl:    adalimumab (HUMIRA) 40 MG/0.8ML injection, Inject 40 mg into the skin once., Disp: , Rfl:    betamethasone dipropionate 0.05 % cream, 1 application to affected area, Disp: , Rfl:    brimonidine (ALPHAGAN P) 0.1 % SOLN, Place 1 drop into both eyes 2 (two) times daily., Disp: , Rfl:    brimonidine (ALPHAGAN P) 0.15 % ophthalmic solution, 1 drop into affected eye, Disp: , Rfl:    calcium carbonate (OS-CAL) 600 MG TABS tablet, Take 600 mg by mouth 2 (two) times daily with a meal., Disp: , Rfl:    celecoxib (CELEBREX) 200 MG capsule, Take 200 mg by mouth 2 (two) times daily., Disp: , Rfl:    cholecalciferol (VITAMIN D) 1000 UNITS tablet, Take 1,000 Units by mouth daily., Disp: , Rfl:  Coenzyme Q10 (COQ-10 PO), Take 1 capsule by mouth daily. , Disp: , Rfl:    Cyanocobalamin (VITAMIN B-12 PO), Take 1 tablet by mouth daily. , Disp: , Rfl:    Cyanocobalamin POWD, Take by mouth., Disp: , Rfl:    cyclobenzaprine (FLEXERIL) 10 MG tablet, Take 10 mg by mouth 3 (three) times daily as needed for muscle spasms. , Disp: , Rfl:    hydrochlorothiazide (HYDRODIURIL) 25 MG tablet, Take 25 mg by mouth daily., Disp: , Rfl:    lansoprazole (PREVACID) 30 MG capsule, Take 30 mg by mouth daily at 12 noon., Disp: , Rfl:    lansoprazole (PREVACID) 30 MG capsule, Take by mouth., Disp: , Rfl:    latanoprost (XALATAN) 0.005 % ophthalmic solution, Place 1 drop into both eyes at bedtime. , Disp: , Rfl:    latanoprost (XALATAN) 0.005 % ophthalmic solution, 1 drop into affected eye in  the evening, Disp: , Rfl:    levothyroxine (SYNTHROID) 25 MCG tablet, Take by mouth., Disp: , Rfl:    magnesium oxide (MAG-OX) 400 MG tablet, Take by mouth., Disp: , Rfl:    MAGNESIUM-OXIDE PO, Take 1 tablet by mouth daily. , Disp: , Rfl:    metoprolol tartrate (LOPRESSOR) 25 MG tablet, 1 tablet as needed, Disp: , Rfl:    Milk Thistle 140 MG CAPS, Take 1 capsule by mouth daily. , Disp: , Rfl:    Omega-3 Fatty Acids (FISH OIL) 1000 MG CAPS, Take 1 capsule by mouth daily. , Disp: , Rfl:    predniSONE (DELTASONE) 10 MG tablet, TAKE 2 TABS DAILY X 7 DAYS, THEN 1 TAB DAILY, Disp: , Rfl:    Tofacitinib Citrate (XELJANZ) 5 MG TABS, Take by mouth 2 (two) times daily., Disp: , Rfl:    traMADol (ULTRAM) 50 MG tablet, Take 1-2 tablets (50-100 mg total) by mouth every 6 (six) hours as needed for moderate pain., Disp: 40 tablet, Rfl: 0   triamcinolone cream (KENALOG) 0.1 %, Apply 1 application topically 2 (two) times daily as needed (ezcema). , Disp: , Rfl:   Allergies  Allergen Reactions   Sulfa Antibiotics Rash   Aspirin Hives   Latex Hives   Lisinopril Swelling   Percocet [Oxycodone-Acetaminophen]     DIZZINESS           Objective:  Physical Exam  General: AAO x3, NAD  Dermatological: Skin is warm, dry and supple bilateral. There are no open sores, no preulcerative lesions, no rash or signs of infection present.  Vascular: Dorsalis Pedis artery and Posterior Tibial artery pedal pulses are 2/4 bilateral with immedate capillary fill time. There is no pain with calf compression, swelling, warmth, erythema.   Neruologic: Grossly intact via light touch bilateral.   Musculoskeletal: Majority tenderness today is localized on the plantar aspect of fifth metatarsal best plantarly.  No significant pain on course the peroneal tendon.  Mild discomfort dorsal fourth metatarsal cuboid joint.  No specific area of tenderness there is no significant edema.  There is no erythema.  Flexor, extensor tendons  appear to be intact.  Significant arthritic changes present the first MPJ.  Muscular strength 5/5 in all groups tested bilateral.  Gait: Unassisted, Nonantalgic.       Assessment:   65 year old female with right foot capsulitis, tendinitis with history of rheumatoid arthritis     Plan:  -Treatment options discussed including all alternatives, risks, and complications -Etiology of symptoms were discussed -X-rays were obtained and reviewed with the patient.  No evidence of acute  fracture.  Arthritic changes present the first MPJ.  Awaiting radiology report. -Discussed repeat steroid injection which was over this.  Discussed different stretching exercises that she can do as well.  Discussed icing as well as topical anti-inflammatories and creams.  Discussed shoes and good arch supports.  She states that she needs shoulder about physical therapy which was tried on her own at home.  If needed we can place referral.  Vivi Barrack DPM

## 2021-11-28 ENCOUNTER — Other Ambulatory Visit: Payer: Self-pay | Admitting: Sports Medicine

## 2021-11-28 DIAGNOSIS — M25511 Pain in right shoulder: Secondary | ICD-10-CM

## 2021-12-16 ENCOUNTER — Other Ambulatory Visit: Payer: 59

## 2021-12-20 ENCOUNTER — Ambulatory Visit
Admission: RE | Admit: 2021-12-20 | Discharge: 2021-12-20 | Disposition: A | Discharge status: Home / Self Care | Payer: 59 | Source: Ambulatory Visit | Attending: Sports Medicine | Admitting: Sports Medicine

## 2021-12-20 ENCOUNTER — Other Ambulatory Visit: Payer: Self-pay

## 2021-12-20 DIAGNOSIS — M25511 Pain in right shoulder: Secondary | ICD-10-CM

## 2022-05-06 ENCOUNTER — Other Ambulatory Visit: Payer: Self-pay | Admitting: Obstetrics and Gynecology

## 2022-05-06 DIAGNOSIS — Z1231 Encounter for screening mammogram for malignant neoplasm of breast: Secondary | ICD-10-CM

## 2022-06-13 ENCOUNTER — Other Ambulatory Visit: Payer: Self-pay | Admitting: Orthopedic Surgery

## 2022-06-13 DIAGNOSIS — M25531 Pain in right wrist: Secondary | ICD-10-CM

## 2022-06-13 DIAGNOSIS — S66811A Strain of other specified muscles, fascia and tendons at wrist and hand level, right hand, initial encounter: Secondary | ICD-10-CM

## 2022-06-13 DIAGNOSIS — M79641 Pain in right hand: Secondary | ICD-10-CM

## 2022-07-02 ENCOUNTER — Ambulatory Visit
Admission: RE | Admit: 2022-07-02 | Discharge: 2022-07-02 | Disposition: A | Discharge status: Home / Self Care | Payer: 59 | Source: Ambulatory Visit | Attending: Orthopedic Surgery | Admitting: Orthopedic Surgery

## 2022-07-02 DIAGNOSIS — M79641 Pain in right hand: Secondary | ICD-10-CM

## 2022-07-02 DIAGNOSIS — S66811A Strain of other specified muscles, fascia and tendons at wrist and hand level, right hand, initial encounter: Secondary | ICD-10-CM

## 2022-07-02 DIAGNOSIS — M25531 Pain in right wrist: Secondary | ICD-10-CM

## 2022-07-16 ENCOUNTER — Ambulatory Visit
Admission: RE | Admit: 2022-07-16 | Discharge: 2022-07-16 | Disposition: A | Discharge status: Home / Self Care | Payer: 59 | Source: Ambulatory Visit | Attending: Obstetrics and Gynecology | Admitting: Obstetrics and Gynecology

## 2022-07-16 DIAGNOSIS — Z1231 Encounter for screening mammogram for malignant neoplasm of breast: Secondary | ICD-10-CM

## 2022-08-01 ENCOUNTER — Encounter: Payer: Self-pay | Admitting: Internal Medicine

## 2022-08-01 ENCOUNTER — Other Ambulatory Visit: Payer: Self-pay

## 2022-08-01 ENCOUNTER — Ambulatory Visit (INDEPENDENT_AMBULATORY_CARE_PROVIDER_SITE_OTHER): Payer: 59 | Admitting: Internal Medicine

## 2022-08-01 DIAGNOSIS — T8450XA Infection and inflammatory reaction due to unspecified internal joint prosthesis, initial encounter: Secondary | ICD-10-CM

## 2022-08-01 NOTE — Progress Notes (Signed)
[facility]      Reason for Consult:2nd opinion regarding elbow infection    Referring Physician: Dr. Reginold Agent    Patient ID: Sharon Bradshaw, female    DOB: [birth year], 65 y.o.   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 She has a history of rheumatoid arthritis and is s/p left elbow arthroplasty with ulnar nerve transposition done in September 2021 at [facility].  She subsequently developed a Pseudomonal infection in November 2021 treated with oral cipro several times including a 6 month course last year.  She initially did well off of antibiotics and elbow was stable and saw ID at [facility] in 12/22 and there was no indication for further antibiotics.  She was to restart her RA medications but recently her elbow has developed pain, swelling and warmth concerning for infection.  Additionally, a recent xray noted lucency and bone abnormalities at the hardware site.   "Radiographs of the left elbow demonstrate an elbow prosthesis with  periprosthetic lucencies and bone erosion. This was not present when  compared to radiographs taken 09/06/2021." She was placed on cipro by her orthopedist and dose optimized when she saw ID at [facility] earlier this month who recommended 2 stage revision with need for surgical debridement, which the patient is hesitant to undergo.   She is here for a second opinion on the optimal treatment for this.     Past Medical History:  Diagnosis Date   Collagen vascular disease (HCC)    Hypertension    RA (rheumatoid arthritis) (HCC)    Spinal stenosis     Prior to Admission medications   Medication Sig Start Date End Date Taking? Authorizing Provider  abatacept (ORENCIA) 250 MG injection Inject into the vein.    [provider]  adalimumab (HUMIRA) 40 MG/0.8ML injection Inject 40 mg into the skin once.    [provider]  albuterol (VENTOLIN HFA) 108 (90 Base) MCG/ACT  inhaler as needed. 06/17/19   [provider]  amLODipine (NORVASC) 2.5 MG tablet 1 tablet 02/26/21   [provider]  betamethasone dipropionate 0.05 % cream 1 application to affected area    [provider]  brimonidine (ALPHAGAN P) 0.1 % SOLN Place 1 drop into both eyes 2 (two) times daily.    [provider]  brimonidine (ALPHAGAN P) 0.15 % ophthalmic solution 1 drop into affected eye    [provider]  calcium carbonate (OS-CAL) 600 MG TABS tablet Take 600 mg by mouth 2 (two) times daily with a meal.    [provider]  celecoxib (CELEBREX) 200 MG capsule Take 200 mg by mouth 2 (two) times daily.    [provider]  celecoxib (CELEBREX) 200 MG capsule TAKE 1 CAPSULE (200 MG TOTAL) BY MOUTH 2 (TWO) TIMES DAILY. 08/02/15   [provider]  cholecalciferol (VITAMIN D) 1000 UNITS tablet Take 1,000 Units by mouth daily.    [provider]  clobetasol ointment (TEMOVATE) 0.05 %  11/26/20   [provider]  Coenzyme Q10 (COQ-10 PO) Take 1 capsule by mouth daily.     [provider]  Cyanocobalamin (VITAMIN B-12 PO) Take 1 tablet by mouth daily.     [provider]  Cyanocobalamin POWD Take by mouth.    [provider]  cyclobenzaprine (FLEXERIL) 10 MG tablet Take 10 mg by mouth 3 (three) times daily as needed for muscle spasms.  [provider]  diclofenac Sodium (VOLTAREN) 1 % GEL Apply topically. 12/06/12   [provider]  EPINEPHrine (EPIPEN 2-PAK) 0.3 mg/0.3 mL IJ SOAJ injection 1 injection 10/23/17   [provider]  estradiol (ESTRACE) 0.1 MG/GM vaginal cream APPLY 1 GRAM VAGINALLY AT  NIGHT FOR 2 WEEKS AND THEN  1 GRAM EVERY OTHER AT NIGHT THEREAFTER 10/07/19   [provider]  hydrochlorothiazide (HYDRODIURIL) 25 MG tablet Take 25 mg by mouth daily.    [provider]  hydrocortisone 2.5 % cream if needed each day. 06/01/19   [provider]  hydroxychloroquine (PLAQUENIL) 200 MG tablet Take by mouth. 12/21/12   [provider]  lansoprazole (PREVACID) 30 MG capsule Take 30 mg by mouth daily at 12 noon.    [provider]  lansoprazole (PREVACID) 30 MG capsule Take by mouth.    [provider]  latanoprost (XALATAN) 0.005 % ophthalmic solution Place 1 drop into both eyes at bedtime.     [provider]  latanoprost (XALATAN) 0.005 % ophthalmic solution 1 drop into affected eye in the evening    [provider]  levothyroxine (SYNTHROID) 25 MCG tablet Take by mouth.    [provider]  lidocaine (LIDODERM) 5 % APPLY 1 TO 3 PATCHES  TOPICALLY TO INTACT SKIN AS NEEDED REMOVE AFTER 12  HOURS ONCE DAILY 08/02/15   [provider]  magnesium oxide (MAG-OX) 400 MG tablet Take by mouth.    [provider]  MAGNESIUM-OXIDE PO Take 1 tablet by mouth daily.     [provider]  metoprolol succinate (TOPROL-XL) 25 MG 24 hr tablet Take by mouth. 05/05/18   [provider]  metoprolol tartrate (LOPRESSOR) 25 MG tablet 1 tablet as needed    [provider]  Milk Thistle 140 MG CAPS Take 1 capsule by mouth daily.     [provider]  Olopatadine HCl (PAZEO) 0.7 % SOLN  12/28/18   [provider]  Omega-3 Fatty Acids (FISH OIL) 1000 MG CAPS Take 1 capsule by mouth daily.     [provider]  predniSONE (DELTASONE) 10 MG tablet TAKE 2 TABS DAILY X 7 DAYS, THEN 1 TAB DAILY    [provider]  Tofacitinib Citrate (XELJANZ) 5 MG TABS Take by mouth 2 (two) times daily.    [provider]  traMADol (ULTRAM) 50 MG tablet Take 1-2 tablets (50-100 mg total) by mouth every 6 (six) hours as needed for moderate pain. 10/31/15   Earnstine Regal, PA-C  triamcinolone cream (KENALOG) 0.1 % Apply 1 application topically 2 (two) times daily as needed (ezcema).     [provider]  triamcinolone cream  (KENALOG) 0.1 % APPLY TO AFFECTED AREA TOPICAL 2(TWO) TIMES A DAY 12/01/18   [provider]    Allergies  Allergen Reactions   Sulfa Antibiotics Rash   Aspirin Hives   Latex Hives   Lisinopril Swelling   Percocet [Oxycodone-Acetaminophen]     DIZZINESS     Social History   Tobacco Use   Smoking status: Former   Smokeless tobacco: Former    Quit date: 02/05/1980  Substance Use Topics   Alcohol use: Yes    Comment: wine daily   Drug use: No    Family History  Problem Relation Age of Onset   Coronary artery disease Mother    Hypertension Mother    Diabetes Mellitus I Mother    Hypertension Father    Coronary artery  disease Father    Cancer Maternal Aunt     Review of Systems  Constitutional: negative for fevers and chills All other systems reviewed and are negative    Constitutional: in no apparent distress There were no vitals filed for this visit. 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  I extensively reviewed the history, xrays and previous notes and I had a frank discussion with her regarding her elbow infection.  I discussed the nature of biofilm and chronic infection and only way to potentially cure this is with hardware removal followed by IV antibiotics then reimplantation can be considered at some point.  It will also be difficult to start  RA medications with an active infection and I doubt any surgeon will want to proceed with other interventions with an active infection.   She voiced her understanding and seems to have decided to go back to Dr. Fredricka Bonine for further management.   Plan: 1)  follow up with orthopedics  2) ID involvement at the time of surgery  She can follow up here as needed.

## 2022-08-23 IMAGING — MR MR SHOULDER*R* W/O CM
5 series · 36 of 40 positions shown · non-contrast
Comparison: X-ray 11/26/2021

CLINICAL DATA: Right shoulder pain and limited range of motion and
weakness for 1 month

EXAM:
MRI OF THE RIGHT SHOULDER WITHOUT CONTRAST
TECHNIQUE: Multiplanar, multisequence MR imaging of the shoulder was performed.
No intravenous contrast was administered.

[Series 3: T2 fat-sat · axial · 4.0mm · 0.59mm/px · z∈[-21,+78]mm · 8 of 25 slices shown (1 of 3)]
[im 1/25]
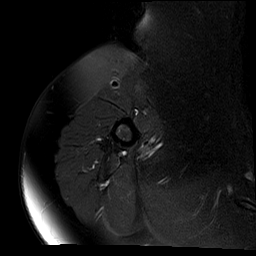
[im 4/25]
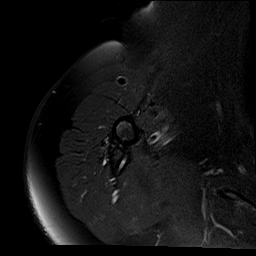
[im 7/25]
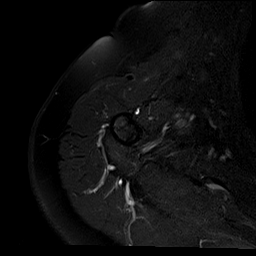
[im 11/25]
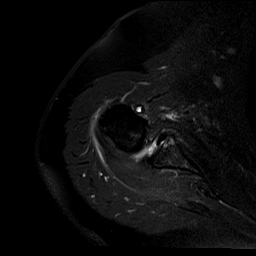
[im 14/25]
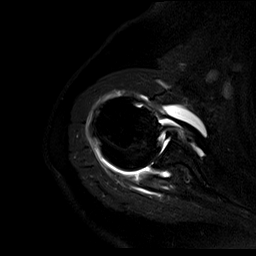
[im 18/25]
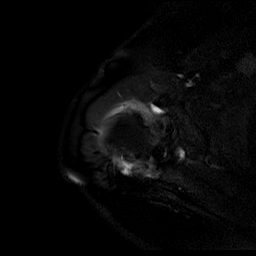
[im 21/25]
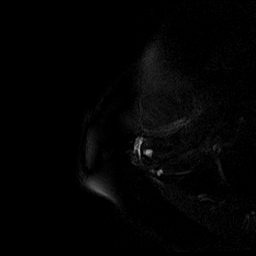
[im 25/25]
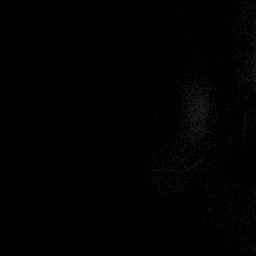

[Series 4: T2 fat-sat · oblique · 4.0mm · 0.62mm/px · 8 of 22 slices shown (2 of 3)]
[im 1/22]
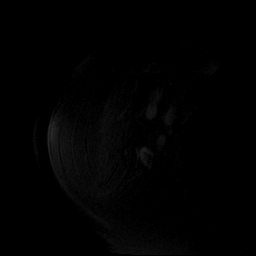
[im 4/22]
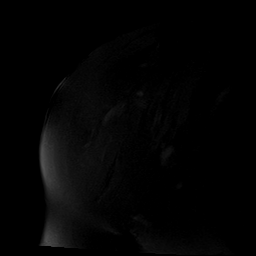
[im 7/22]
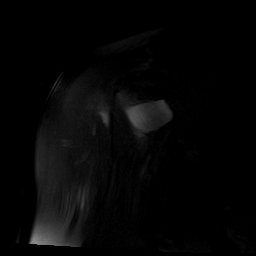
[im 10/22]
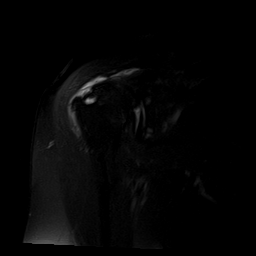
[im 13/22]
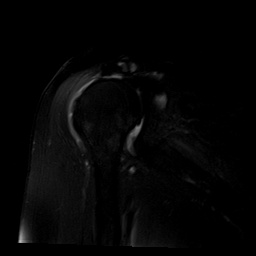
[im 16/22]
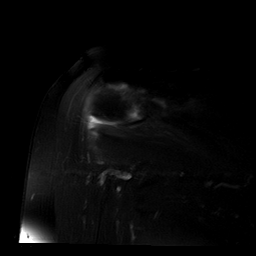
[im 19/22]
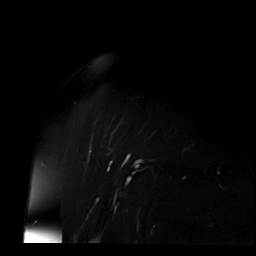
[im 22/22]
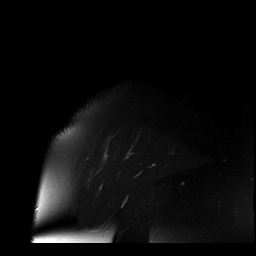

[Series 5: PD · oblique · 4.0mm · 0.31mm/px · 8 of 22 slices shown]
[im 1/22]
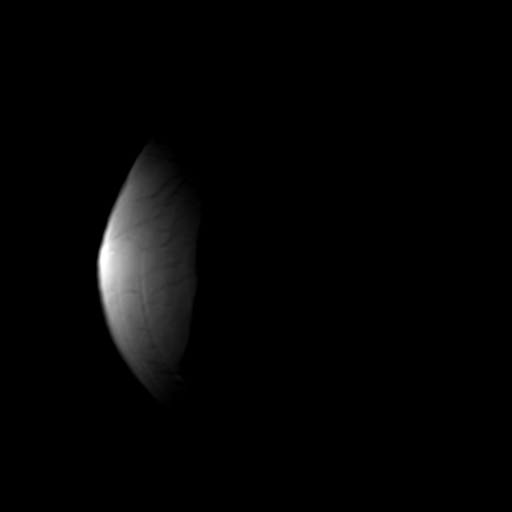
[im 4/22]
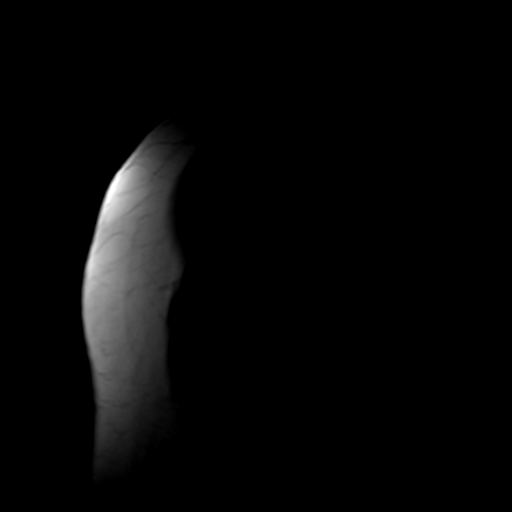
[im 7/22]
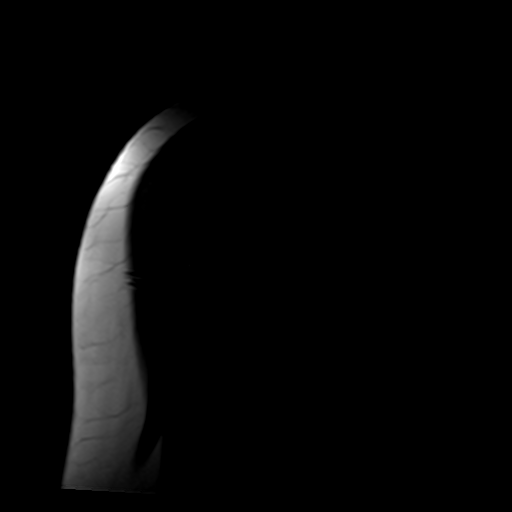
[im 10/22]
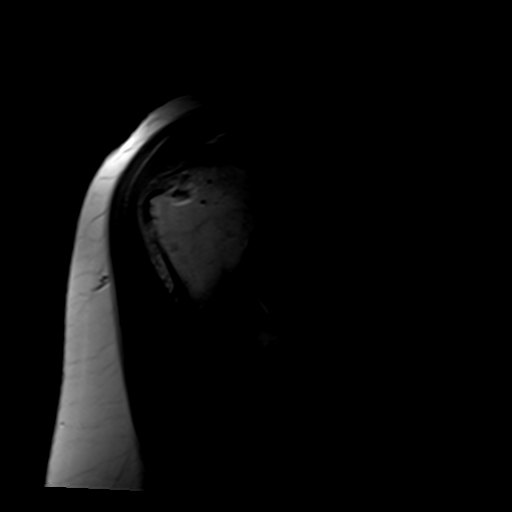
[im 13/22]
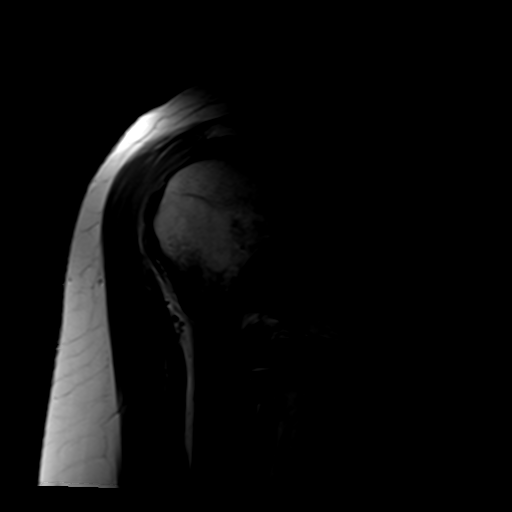
[im 16/22]
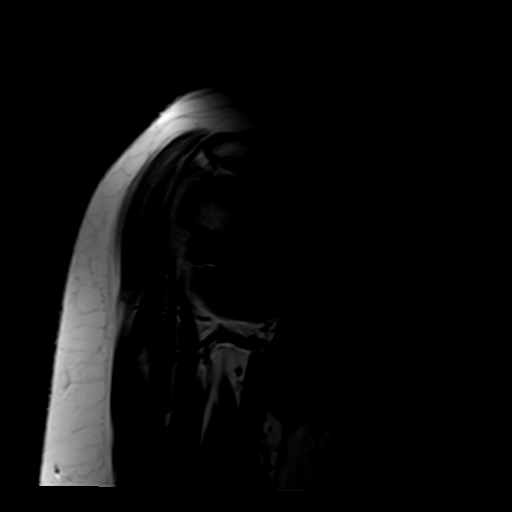
[im 19/22]
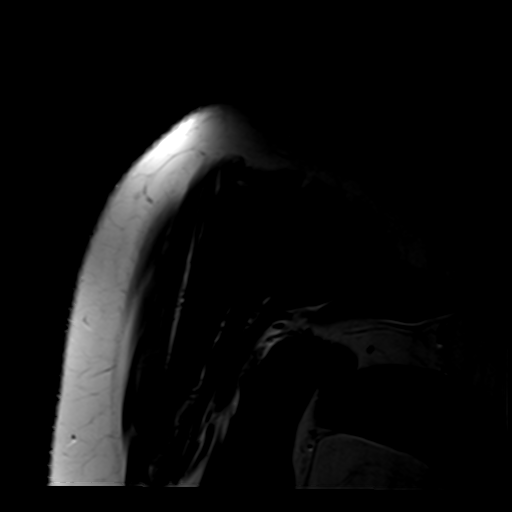
[im 22/22]
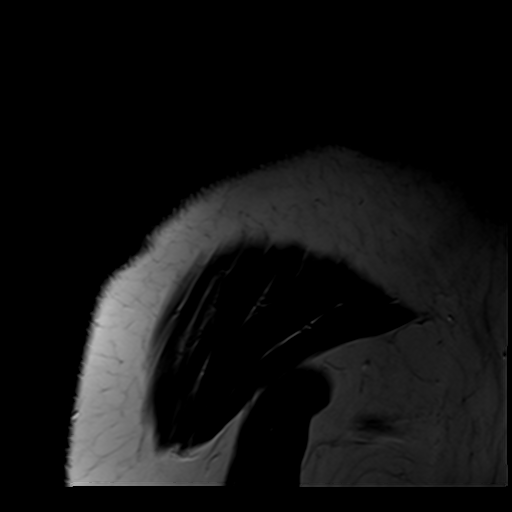

[Series 6: T2 fat-sat · oblique · 4.0mm · 0.62mm/px · 8 of 22 slices shown (3 of 3)]
[im 1/22]
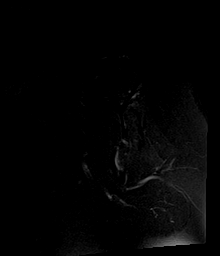
[im 4/22]
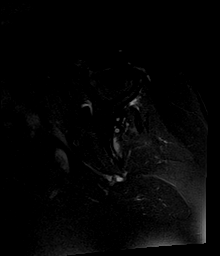
[im 7/22]
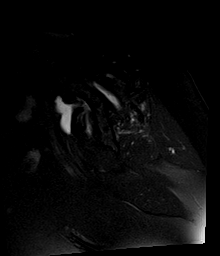
[im 10/22]
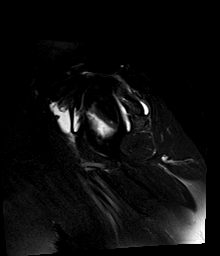
[im 13/22]
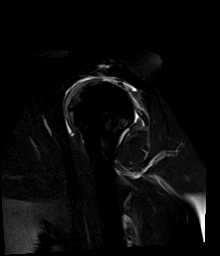
[im 16/22]
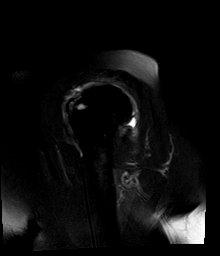
[im 19/22]
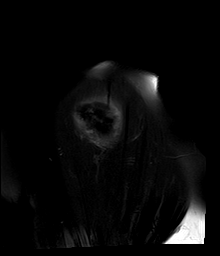
[im 22/22]
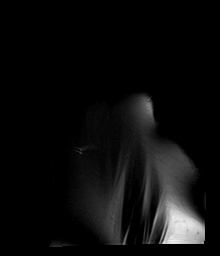

[Series 7: T1 · oblique · 4.0mm · 0.31mm/px · 4 of 22 slices shown]
[im 1/22]
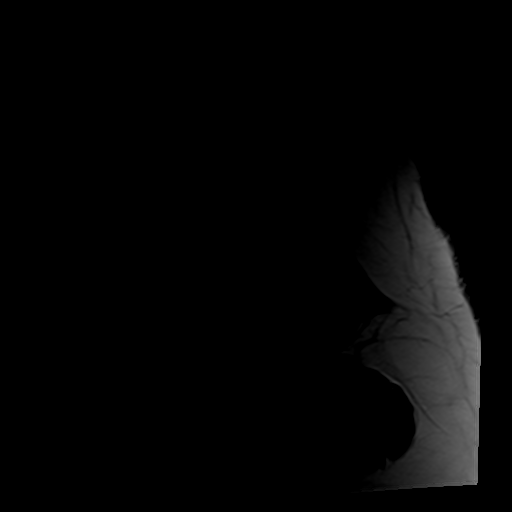
[im 4/22]
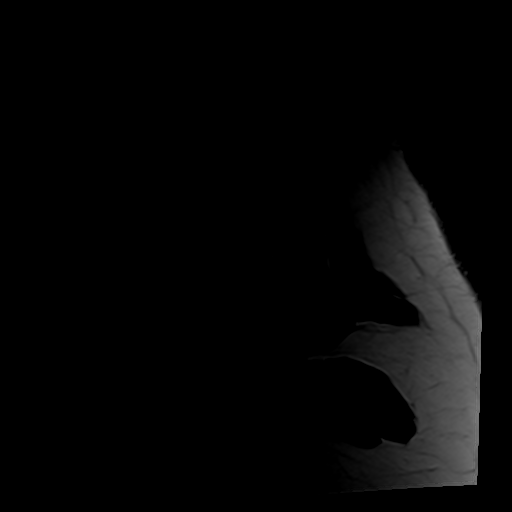
[im 7/22]
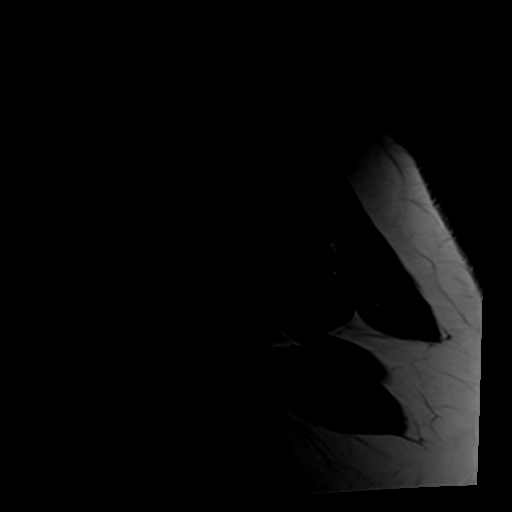
[im 10/22]
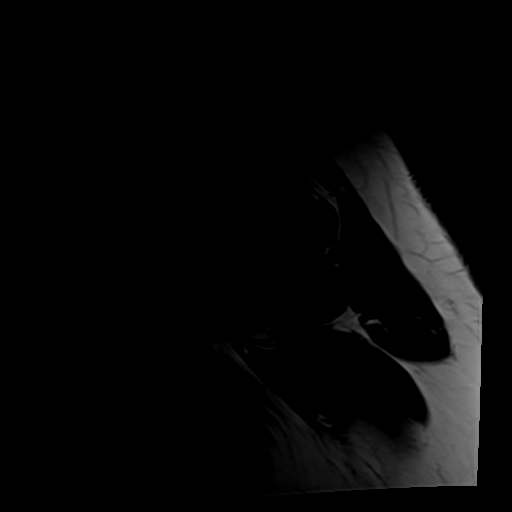

[36 of 40 positions shown; findings below may reference images not displayed]

FINDINGS: Technical Note: Despite efforts by the technologist and patient,
motion artifact is present on today's exam and could not be
eliminated. This reduces exam sensitivity and specificity.

Rotator cuff: Complete full-thickness tears of the supraspinatus and
infraspinatus tendons with retraction to the level of the
glenohumeral joint. High-grade near-complete tear of the
subscapularis tendon. Teres minor intact.

Muscles: Mild atrophy of the infraspinatus and subscapularis
muscles. Infraspinatus muscle is mildly edematous.

Biceps long head:  Not visualized, likely torn and retracted.

Acromioclavicular Joint: Mild degenerative changes of the AC joint.
Moderate volume subacromial-subdeltoid bursal fluid communicating
with the glenohumeral joint.

Glenohumeral Joint: Small joint effusion. Diffuse cartilage loss of
the glenoid and humeral head with small subchondral cystic changes
within the glenoid.

Labrum: Appears diffusely degenerated. Posterior labrum is
diminutive. Evaluation is degraded by motion.

Bones: Humeral head is high-riding and posteriorly subluxed relative
to the glenoid. No dislocation. No acute fracture. No suspicious
bone lesion.

Other: None.
IMPRESSION: 1. Motion degraded exam.
2. Complete full-thickness tears of the supraspinatus and
infraspinatus tendons with retraction to the level of the
glenohumeral joint. Mild atrophy of the infraspinatus and
subscapularis muscles.
3. High-grade near-complete tear of the subscapularis tendon.
4. Nonvisualization of the long head of the biceps tendon, likely
torn and retracted.
5. Moderate glenohumeral and mild AC joint osteoarthritis.

## 2022-12-11 ENCOUNTER — Encounter: Payer: Self-pay | Admitting: Podiatrist

## 2022-12-11 ENCOUNTER — Ambulatory Visit (INDEPENDENT_AMBULATORY_CARE_PROVIDER_SITE_OTHER): Payer: 59 | Admitting: Podiatrist

## 2022-12-11 ENCOUNTER — Ambulatory Visit (INDEPENDENT_AMBULATORY_CARE_PROVIDER_SITE_OTHER): Payer: 59

## 2022-12-11 DIAGNOSIS — M778 Other enthesopathies, not elsewhere classified: Secondary | ICD-10-CM

## 2022-12-11 DIAGNOSIS — M7741 Metatarsalgia, right foot: Secondary | ICD-10-CM | POA: Diagnosis not present

## 2022-12-11 DIAGNOSIS — M79671 Pain in right foot: Secondary | ICD-10-CM

## 2022-12-11 MED ORDER — TRIAMCINOLONE ACETONIDE 10 MG/ML IJ SUSP
10.0000 mg | Freq: Once | INTRAMUSCULAR | Status: AC
  Administered 2022-12-11: 10 mg

## 2022-12-11 NOTE — Progress Notes (Signed)
Chief Complaint  Patient presents with   Foot Pain    Right foot and ankle pain has been on going since summer 2023     HPI: Patient is 66 y.o. female who presents today for right foot pain.  She relates that happened in summer 2023 and has failed to improve.  She has tried wearing a brace on the foot and she has gotten new [employer] and relates no improvement in symptoms.  She has rheumatoid arthritis and relates that she is on oral steroids currently.  Despite this she relates swelling on the lateral portion of the right foot both dorsally and plantarly as well as of the lateral ankle.  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 (Patient not taking: Reported on 08/01/2022)     amLODipine (NORVASC) 2.5 MG tablet 1 tablet     betamethasone dipropionate 0.05 % cream 1 application to affected area     brimonidine (ALPHAGAN P) 0.1 % SOLN Place 1 drop into both eyes 2 (two) times daily.     brimonidine (ALPHAGAN P) 0.15 % ophthalmic solution      calcium carbonate (OS-CAL) 600 MG TABS tablet Take 600 mg by mouth 2 (two) times daily with a meal.     celecoxib (CELEBREX) 200 MG capsule TAKE 1 CAPSULE (200 MG TOTAL) BY MOUTH 2 (TWO) TIMES DAILY.     cholecalciferol (VITAMIN D) 1000 UNITS tablet Take 1,000 Units by mouth daily.     clobetasol ointment (TEMOVATE) 0.05 % daily as needed.     Coenzyme Q10 (COQ-10 PO) Take 1 capsule by mouth daily.      Cyanocobalamin (VITAMIN B-12 PO) Take by mouth daily. Patient states she takes sublingually     diclofenac Sodium (VOLTAREN) 1 % GEL Apply topically.     EPINEPHrine (EPIPEN  2-PAK) 0.3 mg/0.3 mL IJ SOAJ injection 1 injection (Patient not taking: Reported on 08/01/2022)     estradiol (ESTRACE) 0.1 MG/GM vaginal cream 2 (two) times a week.     hydrochlorothiazide (HYDRODIURIL) 25 MG tablet Take 25 mg by mouth daily.     hydrocortisone 2.5 % cream if needed each day.     lansoprazole (PREVACID) 30 MG capsule Take 30 mg by mouth daily at 12 noon.     latanoprost (XALATAN) 0.005 % ophthalmic solution 1 drop into affected eye in the evening     levothyroxine (SYNTHROID) 25 MCG tablet Take by mouth.     lidocaine (LIDODERM) 5 % APPLY 1 TO 3 PATCHES  TOPICALLY TO INTACT SKIN AS NEEDED REMOVE AFTER 12  HOURS ONCE DAILY     magnesium oxide (MAG-OX) 400 MG tablet Take by mouth.     metoprolol succinate (TOPROL-XL) 25 MG 24 hr tablet Take 12.5 mg by mouth.     Milk Thistle 140 MG CAPS Take 1 capsule by mouth daily.      No current facility-administered medications on file prior to visit.    Allergies  Allergen Reactions   Sulfa Antibiotics Rash   Aspirin Hives   Latex Hives   Lisinopril Swelling   Percocet [Oxycodone-Acetaminophen]     DIZZINESS    Tramadol  Hcl Other (See Comments)    Elevated BP/HR, swelling of tongue/arm    Review of Systems No fevers, chills, nausea, muscle aches, no difficulty breathing, no calf pain, no chest pain or shortness of breath.   Physical Exam  GENERAL APPEARANCE: Alert, conversant. Appropriately groomed. No acute distress.   VASCULAR: Pedal pulses palpable 2/4 DP and PT bilateral.  Capillary refill time is immediate to all digits,  Proximal to distal cooling is warm to warm.  Digital perfusion adequate.   NEUROLOGIC: sensation is intact to 5.07 monofilament at 5/5 sites bilateral.  Light touch is intact bilateral, vibratory sensation intact bilateral  MUSCULOSKELETAL: acceptable muscle strength, tone and stability bilateral.  No gross boney pedal deformities noted.  Pain on palpation along the fourth metatarsal of the right foot  is identified.  Pain also along the peroneal tendon posterior to the lateral malleolus is also noted.  Mild swelling lateral midfoot is also noted.  DERMATOLOGIC: skin is warm, supple, and dry.  Color, texture, and turgor of skin within normal limits.  No open wounds are noted.  No preulcerative lesions are seen.  Digital nails are asymptomatic.    X-ray evaluation 3 views of the right foot are obtained.  Decreased joint space and arthritic changes are noted at the first metatarsal phalangeal joint as well as the hallux interphalangeal joint.  Contracture of the second digit is noted on x-ray.  No sign of stress fracture or stress reaction is seen third, fourth or fifth metatarsals.  A large osteophytic spur on the dorsal aspect of the first metatarsal phalangeal joint is noted on the lateral view.  Tiny inferior calcaneal spur is noted.  Comparison with x-rays from August 2022 showed no changes.  Assessment     ICD-10-CM   1. Right foot pain  M79.671 DG Foot Complete Right    2. Capsulitis of foot, right  M77.8     3. Metatarsalgia of right foot  M77.41        Plan  Exam and x-ray findings were discussed with the patient.  Her symptoms are similar to that of the stress fracture however on x-ray no sign of stress fracture or stress reaction is seen.  She does have soft tissue pain between the fourth and fifth metatarsals and I recommended trying an injection of steroid to see if this would help with this discomfort.  She did agree I prepped the skin with alcohol infiltrated 10 mg of Kenalog with Marcaine plain into the area of maximal tenderness right foot.  She tolerated this well.  Recommended switching out her Shon Baton inserts with aetrex inserts that she has at home.  Also discussed the potential for physical therapy and/or boot immobilization in the future.  She will be seen back in 4 weeks for recheck and will call sooner if any questions or concerns arise.

## 2023-01-16 ENCOUNTER — Ambulatory Visit: Payer: Medicare Other | Admitting: Podiatry

## 2023-01-16 DIAGNOSIS — M84374S Stress fracture, right foot, sequela: Secondary | ICD-10-CM | POA: Diagnosis not present

## 2023-01-16 DIAGNOSIS — M7741 Metatarsalgia, right foot: Secondary | ICD-10-CM | POA: Diagnosis not present

## 2023-01-16 DIAGNOSIS — M778 Other enthesopathies, not elsewhere classified: Secondary | ICD-10-CM

## 2023-01-16 NOTE — Progress Notes (Signed)
Chief Complaint  Patient presents with   Follow-up    1 month follow up right foot pain     HPI: Patient is 67 y.o. female who presents today for follow-up of right foot pain.  She relates that happened in summer 2023 and has failed to improve.  She relates the injection at last visit did not help for long maybe a couple days.  She has rheumatoid arthritis and relates that she is on oral steroids currently.  Despite this she relates swelling on the lateral portion of the right foot both dorsally and plantarly as well as of the lateral ankle.   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 (Patient not taking: Reported on 08/01/2022)     amLODipine (NORVASC) 2.5 MG tablet 1 tablet     betamethasone dipropionate 2.94 % cream 1 application to affected area     brimonidine (ALPHAGAN P) 0.1 % SOLN Place 1 drop into both eyes 2 (two) times daily.     brimonidine (ALPHAGAN P) 0.15 % ophthalmic solution      calcium carbonate (OS-CAL) 600 MG TABS tablet Take 600 mg by mouth 2 (two) times daily with a meal.     celecoxib (CELEBREX) 200 MG capsule TAKE 1 CAPSULE (200 MG TOTAL) BY MOUTH 2 (TWO) TIMES DAILY.     cholecalciferol (VITAMIN D) 1000 UNITS tablet Take 1,000 Units by mouth daily.     clobetasol ointment (TEMOVATE) 0.05 % daily as needed.     Coenzyme Q10 (COQ-10 PO) Take 1 capsule by mouth daily.      Cyanocobalamin (VITAMIN B-12 PO) Take by mouth daily. Patient states she takes sublingually     diclofenac Sodium (VOLTAREN) 1 % GEL Apply topically.     EPINEPHrine (EPIPEN 2-PAK) 0.3 mg/0.3 mL IJ SOAJ injection 1 injection  (Patient not taking: Reported on 08/01/2022)     estradiol (ESTRACE) 0.1 MG/GM vaginal cream 2 (two) times a week.     hydrochlorothiazide (HYDRODIURIL) 25 MG tablet Take 25 mg by mouth daily.     hydrocortisone 2.5 % cream if needed each day.     lansoprazole (PREVACID) 30 MG capsule Take 30 mg by mouth daily at 12 noon.     latanoprost (XALATAN) 0.005 % ophthalmic solution 1 drop into affected eye in the evening     levothyroxine (SYNTHROID) 25 MCG tablet Take by mouth.     lidocaine (LIDODERM) 5 % APPLY 1 TO 3 PATCHES  TOPICALLY TO INTACT SKIN AS NEEDED REMOVE AFTER 12  HOURS ONCE DAILY     magnesium oxide (MAG-OX) 400 MG tablet Take by mouth.     metoprolol succinate (TOPROL-XL) 25 MG 24 hr tablet Take 12.5 mg by mouth.     Milk Thistle 140 MG CAPS Take 1 capsule by mouth daily.      No current facility-administered medications on file prior to visit.    Allergies  Allergen Reactions   Sulfa Antibiotics Rash   Aspirin Hives   Latex Hives   Lisinopril Swelling   Percocet [Oxycodone-Acetaminophen]     DIZZINESS    Tramadol Hcl Other (See Comments)    Elevated BP/HR, swelling  of tongue/arm    Review of Systems No fevers, chills, nausea, muscle aches, no difficulty breathing, no calf pain, no chest pain or shortness of breath.   Physical Exam  GENERAL APPEARANCE: Alert, conversant. Appropriately groomed. No acute distress.   VASCULAR: Pedal pulses palpable 2/4 DP and PT bilateral.  Capillary refill time is immediate to all digits,  Proximal to distal cooling is warm to warm.  Digital perfusion adequate.   NEUROLOGIC: sensation is intact to 5.07 monofilament at 5/5 sites bilateral.  Light touch is intact bilateral, vibratory sensation intact bilateral  MUSCULOSKELETAL: acceptable muscle strength, tone and stability bilateral.  No gross boney pedal deformities noted.  Pain on palpation along the fourth metatarsal of the right foot is identified.  Pain also along the peroneal  tendon posterior to the lateral malleolus is also noted.  Mild swelling lateral midfoot is also noted.  DERMATOLOGIC: skin is warm, supple, and dry.  Color, texture, and turgor of skin within normal limits.  No open wounds are noted.  No preulcerative lesions are seen.  Digital nails are asymptomatic.    X-ray evaluation 3 views of the right foot are obtained.  Decreased joint space and arthritic changes are noted at the first metatarsal phalangeal joint as well as the hallux interphalangeal joint.  Contracture of the second digit is noted on x-ray.  No sign of stress fracture or stress reaction is seen third, fourth or fifth metatarsals.  A large osteophytic spur on the dorsal aspect of the first metatarsal phalangeal joint is noted on the lateral view.  Tiny inferior calcaneal spur is noted.  Comparison with x-rays from August 2022 showed no changes.  Assessment     ICD-10-CM   1. Capsulitis of foot, right  M77.8     2. Metatarsalgia of right foot  M77.41     3. Stress reaction of right foot, sequela  M84.374S         Plan  Exam and x-ray findings were discussed with the patient.  Her symptoms are similar to that of the stress fracture however on x-ray no sign of stress fracture or stress reaction is seen.   -Xrays reviewed -Discussed treatement options for possible stress fracture or reaction vs arthritis; risks, alternatives, and benefits explained..  -Dispensed CAM boot. Patient to wear at all times and instructed on use -Recommend protection, rest, ice, elevation daily until symptoms improve -Rx pain med/antinflammatories as needed -Patient to return to office in 4 weeks for serial x-rays to assess healing  or sooner if condition worsens.

## 2023-02-02 DIAGNOSIS — B965 Pseudomonas (aeruginosa) (mallei) (pseudomallei) as the cause of diseases classified elsewhere: Secondary | ICD-10-CM | POA: Diagnosis not present

## 2023-02-02 DIAGNOSIS — Z79899 Other long term (current) drug therapy: Secondary | ICD-10-CM | POA: Diagnosis not present

## 2023-02-02 DIAGNOSIS — M069 Rheumatoid arthritis, unspecified: Secondary | ICD-10-CM | POA: Diagnosis not present

## 2023-02-02 DIAGNOSIS — T8450XD Infection and inflammatory reaction due to unspecified internal joint prosthesis, subsequent encounter: Secondary | ICD-10-CM | POA: Diagnosis not present

## 2023-02-02 DIAGNOSIS — Y839 Surgical procedure, unspecified as the cause of abnormal reaction of the patient, or of later complication, without mention of misadventure at the time of the procedure: Secondary | ICD-10-CM | POA: Diagnosis not present

## 2023-02-05 ENCOUNTER — Telehealth: Payer: Self-pay | Admitting: *Deleted

## 2023-02-05 NOTE — Telephone Encounter (Signed)
Patient is calling to ask how long she is supposed to wear her boot, please advise.

## 2023-02-12 ENCOUNTER — Ambulatory Visit (INDEPENDENT_AMBULATORY_CARE_PROVIDER_SITE_OTHER): Payer: Medicare Other

## 2023-02-12 ENCOUNTER — Ambulatory Visit: Payer: Medicare Other | Admitting: Podiatry

## 2023-02-12 ENCOUNTER — Encounter: Payer: Self-pay | Admitting: Podiatry

## 2023-02-12 DIAGNOSIS — M778 Other enthesopathies, not elsewhere classified: Secondary | ICD-10-CM

## 2023-02-12 DIAGNOSIS — M84374S Stress fracture, right foot, sequela: Secondary | ICD-10-CM | POA: Diagnosis not present

## 2023-02-12 DIAGNOSIS — M7731 Calcaneal spur, right foot: Secondary | ICD-10-CM | POA: Diagnosis not present

## 2023-02-12 DIAGNOSIS — M19071 Primary osteoarthritis, right ankle and foot: Secondary | ICD-10-CM | POA: Diagnosis not present

## 2023-02-12 DIAGNOSIS — M79671 Pain in right foot: Secondary | ICD-10-CM | POA: Diagnosis not present

## 2023-02-12 NOTE — Addendum Note (Signed)
Addended by: Ruthell Rummage on: 02/12/2023 01:47 PM   Modules accepted: Orders

## 2023-02-12 NOTE — Progress Notes (Signed)
Chief Complaint  Patient presents with   Foot Pain    Right foot pain , patient states she is not sure if her foot pain is cause by arthritis      HPI: Patient is 67 y.o. female who presents today for follow-up of right foot pain.  Relates some days she does better and some days are worse. Relates the boot has helped but is uncomfortable. She relates that happened in summer 2023 and has failed to improve.   She has rheumatoid arthritis and relates that she is on oral steroids currently.   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 (Patient not taking: Reported on 08/01/2022)     amLODipine (NORVASC) 2.5 MG tablet 1 tablet     betamethasone dipropionate [id number] % cream 1 application to affected area     brimonidine (ALPHAGAN P) 0.1 % SOLN Place 1 drop into both eyes 2 (two) times daily.     brimonidine (ALPHAGAN P) 0.15 % ophthalmic solution      calcium carbonate (OS-CAL) 600 MG TABS tablet Take 600 mg by mouth 2 (two) times daily with a meal.     celecoxib (CELEBREX) 200 MG capsule TAKE 1 CAPSULE (200 MG TOTAL) BY MOUTH 2 (TWO) TIMES DAILY.     cholecalciferol (VITAMIN D) 1000 UNITS tablet Take 1,000 Units by mouth daily.     clobetasol ointment (TEMOVATE) 0.05 % daily as needed.     Coenzyme Q10 (COQ-10 PO) Take 1 capsule by mouth daily.      Cyanocobalamin (VITAMIN B-12 PO) Take by mouth daily. Patient states she takes sublingually     diclofenac Sodium (VOLTAREN) 1 % GEL Apply topically.     EPINEPHrine (EPIPEN 2-PAK) 0.3 mg/0.3 mL IJ SOAJ injection 1 injection (Patient not taking: Reported on 08/01/2022)     estradiol  (ESTRACE) 0.1 MG/GM vaginal cream 2 (two) times a week.     hydrochlorothiazide (HYDRODIURIL) 25 MG tablet Take 25 mg by mouth daily.     hydrocortisone 2.5 % cream if needed each day.     lansoprazole (PREVACID) 30 MG capsule Take 30 mg by mouth daily at 12 noon.     latanoprost (XALATAN) 0.005 % ophthalmic solution 1 drop into affected eye in the evening     levothyroxine (SYNTHROID) 25 MCG tablet Take by mouth.     lidocaine (LIDODERM) 5 % APPLY 1 TO 3 PATCHES  TOPICALLY TO INTACT SKIN AS NEEDED REMOVE AFTER 12  HOURS ONCE DAILY     magnesium oxide (MAG-OX) 400 MG tablet Take by mouth.     metoprolol succinate (TOPROL-XL) 25 MG 24 hr tablet Take 12.5 mg by mouth.     Milk Thistle 140 MG CAPS Take 1 capsule by mouth daily.      No current facility-administered medications on file prior to visit.    Allergies  Allergen Reactions   Sulfa Antibiotics Rash   Aspirin Hives   Latex Hives   Lisinopril Swelling   Percocet [Oxycodone-Acetaminophen]     DIZZINESS    Tramadol Hcl Other (See Comments)    Elevated BP/HR, swelling of tongue/arm    Review of Systems No  fevers, chills, nausea, muscle aches, no difficulty breathing, no calf pain, no chest pain or shortness of breath.   Physical Exam  GENERAL APPEARANCE: Alert, conversant. Appropriately groomed. No acute distress.   VASCULAR: Pedal pulses palpable 2/4 DP and PT bilateral.  Capillary refill time is immediate to all digits,  Proximal to distal cooling is warm to warm.  Digital perfusion adequate.   NEUROLOGIC: sensation is intact to 5.07 monofilament at 5/5 sites bilateral.  Light touch is intact bilateral, vibratory sensation intact bilateral  MUSCULOSKELETAL: acceptable muscle strength, tone and stability bilateral.  No gross boney pedal deformities noted.  Pain on palpation along the fourth metatarsal of the right foot is identified.  Pain also along the peroneal tendon posterior to the lateral malleolus is also noted.  Mild  swelling lateral midfoot is also noted.  DERMATOLOGIC: skin is warm, supple, and dry.  Color, texture, and turgor of skin within normal limits.  No open wounds are noted.  No preulcerative lesions are seen.  Digital nails are asymptomatic.    X-ray evaluation 3 views of the right foot are obtained.  Decreased joint space and arthritic changes are noted at the first metatarsal phalangeal joint as well as the hallux interphalangeal joint.  Contracture of the second digit is noted on x-ray.  No sign of stress fracture or stress reaction is seen third, fourth or fifth metatarsals.  A large osteophytic spur on the dorsal aspect of the first metatarsal phalangeal joint is noted on the lateral view.  Tiny inferior calcaneal spur is noted.  Comparison with x-rays from August 2022 showed no changes.  Assessment     ICD-10-CM   1. Stress reaction of right foot, sequela  M84.374S     2. Capsulitis of foot, right  M77.8          Plan  Exam and x-ray findings were discussed with the patient.  Her symptoms are similar to that of the stress fracture however on x-ray no sign of stress fracture or stress reaction is seen.   -Xrays reviewed -Discussed treatement options for possible stress fracture or reaction vs arthritis; risks, alternatives, and benefits explained..  -May begin transition out of the boot.  -Recommend protection, rest, ice, elevation daily until symptoms improve -Rx pain med/antinflammatories as needed -If continued pain discussed potential MRI.  -Patient to return to office in about two months if continues to have issues.

## 2023-02-19 ENCOUNTER — Telehealth: Payer: Self-pay

## 2023-02-19 NOTE — Telephone Encounter (Signed)
No further evaluation is needed. 

## 2023-03-02 DIAGNOSIS — Z96622 Presence of left artificial elbow joint: Secondary | ICD-10-CM | POA: Diagnosis not present

## 2023-03-02 DIAGNOSIS — Z4889 Encounter for other specified surgical aftercare: Secondary | ICD-10-CM | POA: Diagnosis not present

## 2023-03-04 DIAGNOSIS — M869 Osteomyelitis, unspecified: Secondary | ICD-10-CM | POA: Diagnosis not present

## 2023-03-04 DIAGNOSIS — M25511 Pain in right shoulder: Secondary | ICD-10-CM | POA: Diagnosis not present

## 2023-03-04 DIAGNOSIS — Z79899 Other long term (current) drug therapy: Secondary | ICD-10-CM | POA: Diagnosis not present

## 2023-03-04 DIAGNOSIS — M069 Rheumatoid arthritis, unspecified: Secondary | ICD-10-CM | POA: Diagnosis not present

## 2023-04-15 DIAGNOSIS — M0589 Other rheumatoid arthritis with rheumatoid factor of multiple sites: Secondary | ICD-10-CM | POA: Diagnosis not present

## 2023-04-15 DIAGNOSIS — M069 Rheumatoid arthritis, unspecified: Secondary | ICD-10-CM | POA: Diagnosis not present

## 2023-04-23 ENCOUNTER — Ambulatory Visit: Payer: Medicare Other | Admitting: Podiatry

## 2023-04-23 ENCOUNTER — Encounter: Payer: Self-pay | Admitting: Podiatry

## 2023-04-23 DIAGNOSIS — M778 Other enthesopathies, not elsewhere classified: Secondary | ICD-10-CM

## 2023-04-23 DIAGNOSIS — M84374S Stress fracture, right foot, sequela: Secondary | ICD-10-CM

## 2023-04-23 DIAGNOSIS — M19071 Primary osteoarthritis, right ankle and foot: Secondary | ICD-10-CM

## 2023-04-23 DIAGNOSIS — M7741 Metatarsalgia, right foot: Secondary | ICD-10-CM | POA: Diagnosis not present

## 2023-04-23 MED ORDER — DEXAMETHASONE SODIUM PHOSPHATE 120 MG/30ML IJ SOLN
4.0000 mg | Freq: Once | INTRAMUSCULAR | Status: AC
  Administered 2023-04-23: 4 mg via INTRA_ARTICULAR

## 2023-04-23 NOTE — Progress Notes (Signed)
No chief complaint on file.    HPI: Patient is 67 y.o. female who presents today for follow-up of right foot pain.  Relates some days she does better and some days are worse. Relates she tried to go back into the boot but was painful. She is about to go on vacation and hoping for injection today to help with pain as it helped before.  She relates that happened in summer 2023 and has failed to improve.   She has rheumatoid arthritis and relates that she is on oral steroids currently.   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 (Patient not taking: Reported on 08/01/2022)     amLODipine (NORVASC) 2.5 MG tablet 1 tablet     betamethasone dipropionate 0.05 % cream 1 application to affected area     brimonidine (ALPHAGAN P) 0.1 % SOLN Place 1 drop into both eyes 2 (two) times daily.     brimonidine (ALPHAGAN P) 0.15 % ophthalmic solution      calcium carbonate (OS-CAL) 600 MG TABS tablet Take 600 mg by mouth 2 (two) times daily with a meal.     celecoxib (CELEBREX) 200 MG capsule TAKE 1 CAPSULE (200 MG TOTAL) BY MOUTH 2 (TWO) TIMES DAILY.     cholecalciferol (VITAMIN D) 1000 UNITS tablet Take 1,000 Units by mouth daily.     clobetasol ointment (TEMOVATE) 0.05 % daily as needed.     Coenzyme Q10 (COQ-10 PO) Take 1 capsule by mouth daily.      Cyanocobalamin (VITAMIN B-12 PO) Take by mouth daily. Patient states she takes sublingually     diclofenac Sodium (VOLTAREN) 1 % GEL Apply topically.     EPINEPHrine (EPIPEN 2-PAK) 0.3 mg/0.3 mL IJ SOAJ injection 1 injection (Patient not taking: Reported on 08/01/2022)     estradiol (ESTRACE) 0.1  MG/GM vaginal cream 2 (two) times a week.     hydrochlorothiazide (HYDRODIURIL) 25 MG tablet Take 25 mg by mouth daily.     hydrocortisone 2.5 % cream if needed each day.     lansoprazole (PREVACID) 30 MG capsule Take 30 mg by mouth daily at 12 noon.     latanoprost (XALATAN) 0.005 % ophthalmic solution 1 drop into affected eye in the evening     levothyroxine (SYNTHROID) 25 MCG tablet Take by mouth.     lidocaine (LIDODERM) 5 % APPLY 1 TO 3 PATCHES  TOPICALLY TO INTACT SKIN AS NEEDED REMOVE AFTER 12  HOURS ONCE DAILY     magnesium oxide (MAG-OX) 400 MG tablet Take by mouth.     metoprolol succinate (TOPROL-XL) 25 MG 24 hr tablet Take 12.5 mg by mouth.     Milk Thistle 140 MG CAPS Take 1 capsule by mouth daily.      No current facility-administered medications on file prior to visit.    Allergies  Allergen Reactions   Sulfa Antibiotics Rash   Aspirin Hives   Latex Hives   Lisinopril Swelling   Percocet [Oxycodone-Acetaminophen]     DIZZINESS    Tramadol Hcl Other (See Comments)    Elevated BP/HR, swelling of tongue/arm    Review of Systems No fevers, chills, nausea, muscle  aches, no difficulty breathing, no calf pain, no chest pain or shortness of breath.   Physical Exam  GENERAL APPEARANCE: Alert, conversant. Appropriately groomed. No acute distress.   VASCULAR: Pedal pulses palpable 2/4 DP and PT bilateral.  Capillary refill time is immediate to all digits,  Proximal to distal cooling is warm to warm.  Digital perfusion adequate.   NEUROLOGIC: sensation is intact to 5.07 monofilament at 5/5 sites bilateral.  Light touch is intact bilateral, vibratory sensation intact bilateral  MUSCULOSKELETAL: acceptable muscle strength, tone and stability bilateral.  No gross boney pedal deformities noted.  Pain on palpation along the fourth metatarsal of the right foot is identified.  Pain also along the peroneal tendon posterior to the lateral malleolus is also noted.  Mild swelling  lateral midfoot is also noted. Most pain today over the dorsum of the foot around the second and third tarsometatarsal joints.   DERMATOLOGIC: skin is warm, supple, and dry.  Color, texture, and turgor of skin within normal limits.  No open wounds are noted.  No preulcerative lesions are seen.  Digital nails are asymptomatic.    X-ray evaluation 3 views of the right foot are obtained.  Decreased joint space and arthritic changes are noted at the first metatarsal phalangeal joint as well as the hallux interphalangeal joint.  Contracture of the second digit is noted on x-ray.  No sign of stress fracture or stress reaction is seen third, fourth or fifth metatarsals.  A large osteophytic spur on the dorsal aspect of the first metatarsal phalangeal joint is noted on the lateral view.  Tiny inferior calcaneal spur is noted.  Comparison with x-rays from August 2022 showed no changes.  Assessment   No diagnosis found.      Plan  Exam and x-ray findings were discussed with the patient.  Her symptoms are similar to that of the stress fracture however on x-ray no sign of stress fracture or stress reaction is seen.   -Xrays reviewed -Discussed treatement options for possible stress fracture or reaction vs arthritis; risks, alternatives, and benefits explained..  -Recommend protection, rest, ice, elevation daily until symptoms improve -Rx pain med/antinflammatories as needed =-MRI ordered to identify soruce of pain and for possible surgical planning.  Injection offered today to help with trip. Procedure below.  -Patient to return to office after MRI for review.   Procedure: Injection Tendon/Ligament Discussed alternatives, risks, complications and verbal consent was obtained.  Location: Right second and third tarsometatarsal joint area. . Skin Prep: Alcohol. Injectate: 1cc 0.5% marcaine plain, 1 cc dexamethasone.  Disposition: Patient tolerated procedure well. Injection site dressed with a band-aid.   Post-injection care was discussed and return precautions discussed.

## 2023-05-13 DIAGNOSIS — M069 Rheumatoid arthritis, unspecified: Secondary | ICD-10-CM | POA: Diagnosis not present

## 2023-05-13 DIAGNOSIS — M25532 Pain in left wrist: Secondary | ICD-10-CM | POA: Diagnosis not present

## 2023-05-13 DIAGNOSIS — M869 Osteomyelitis, unspecified: Secondary | ICD-10-CM | POA: Diagnosis not present

## 2023-05-13 DIAGNOSIS — M0589 Other rheumatoid arthritis with rheumatoid factor of multiple sites: Secondary | ICD-10-CM | POA: Diagnosis not present

## 2023-05-13 DIAGNOSIS — Z79899 Other long term (current) drug therapy: Secondary | ICD-10-CM | POA: Diagnosis not present

## 2023-05-14 ENCOUNTER — Telehealth: Payer: Self-pay | Admitting: *Deleted

## 2023-05-14 NOTE — Telephone Encounter (Signed)
error 

## 2023-05-15 ENCOUNTER — Encounter: Payer: Self-pay | Admitting: Podiatry

## 2023-06-01 DIAGNOSIS — H52223 Regular astigmatism, bilateral: Secondary | ICD-10-CM | POA: Diagnosis not present

## 2023-06-01 DIAGNOSIS — H35413 Lattice degeneration of retina, bilateral: Secondary | ICD-10-CM | POA: Diagnosis not present

## 2023-06-01 DIAGNOSIS — H25813 Combined forms of age-related cataract, bilateral: Secondary | ICD-10-CM | POA: Diagnosis not present

## 2023-06-01 DIAGNOSIS — H04123 Dry eye syndrome of bilateral lacrimal glands: Secondary | ICD-10-CM | POA: Diagnosis not present

## 2023-06-01 DIAGNOSIS — H40053 Ocular hypertension, bilateral: Secondary | ICD-10-CM | POA: Diagnosis not present

## 2023-06-01 DIAGNOSIS — H5213 Myopia, bilateral: Secondary | ICD-10-CM | POA: Diagnosis not present

## 2023-06-01 DIAGNOSIS — H524 Presbyopia: Secondary | ICD-10-CM | POA: Diagnosis not present

## 2023-06-08 ENCOUNTER — Ambulatory Visit
Admission: RE | Admit: 2023-06-08 | Discharge: 2023-06-08 | Disposition: A | Discharge status: Home / Self Care | Payer: Medicare Other | Source: Ambulatory Visit | Attending: Podiatry | Admitting: Podiatry

## 2023-06-08 DIAGNOSIS — M7741 Metatarsalgia, right foot: Secondary | ICD-10-CM

## 2023-06-08 DIAGNOSIS — M84374S Stress fracture, right foot, sequela: Secondary | ICD-10-CM

## 2023-06-08 DIAGNOSIS — M19071 Primary osteoarthritis, right ankle and foot: Secondary | ICD-10-CM

## 2023-06-08 DIAGNOSIS — M778 Other enthesopathies, not elsewhere classified: Secondary | ICD-10-CM

## 2023-06-10 DIAGNOSIS — M0589 Other rheumatoid arthritis with rheumatoid factor of multiple sites: Secondary | ICD-10-CM | POA: Diagnosis not present

## 2023-06-12 ENCOUNTER — Ambulatory Visit: Payer: Medicare Other | Admitting: Podiatry

## 2023-06-16 ENCOUNTER — Other Ambulatory Visit: Payer: Self-pay | Admitting: Obstetrics and Gynecology

## 2023-06-16 DIAGNOSIS — Z1231 Encounter for screening mammogram for malignant neoplasm of breast: Secondary | ICD-10-CM

## 2023-06-25 ENCOUNTER — Encounter: Payer: Self-pay | Admitting: Podiatry

## 2023-06-25 ENCOUNTER — Ambulatory Visit: Payer: Medicare Other | Admitting: Podiatry

## 2023-06-25 DIAGNOSIS — M19071 Primary osteoarthritis, right ankle and foot: Secondary | ICD-10-CM

## 2023-06-25 DIAGNOSIS — I358 Other nonrheumatic aortic valve disorders: Secondary | ICD-10-CM | POA: Diagnosis not present

## 2023-06-25 DIAGNOSIS — Z1330 Encounter for screening examination for mental health and behavioral disorders, unspecified: Secondary | ICD-10-CM | POA: Diagnosis not present

## 2023-06-25 DIAGNOSIS — M25572 Pain in left ankle and joints of left foot: Secondary | ICD-10-CM | POA: Diagnosis not present

## 2023-06-25 DIAGNOSIS — I493 Ventricular premature depolarization: Secondary | ICD-10-CM | POA: Diagnosis not present

## 2023-06-25 DIAGNOSIS — I10 Essential (primary) hypertension: Secondary | ICD-10-CM | POA: Diagnosis not present

## 2023-06-25 DIAGNOSIS — R002 Palpitations: Secondary | ICD-10-CM | POA: Diagnosis not present

## 2023-06-25 MED ORDER — DEXAMETHASONE SODIUM PHOSPHATE 120 MG/30ML IJ SOLN
4.0000 mg | Freq: Once | INTRAMUSCULAR | Status: AC
  Administered 2023-06-25: 4 mg via INTRA_ARTICULAR

## 2023-06-25 MED ORDER — TRIAMCINOLONE ACETONIDE 10 MG/ML IJ SUSP
10.0000 mg | Freq: Once | INTRAMUSCULAR | Status: AC
  Administered 2023-06-25: 10 mg

## 2023-06-25 NOTE — Progress Notes (Signed)
Chief Complaint  Patient presents with   MRI    MRI results      HPI: Patient is 67 y.o. female who presents today for follow-up of right foot pain.  Here to review MRI. Relates right foot is doing better and injection helped. States now the left ankle area is acting up.    She has rheumatoid arthritis and relates that she is on oral steroids currently.   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 (Patient not taking: Reported on 08/01/2022)     amLODipine (NORVASC) 2.5 MG tablet 1 tablet     betamethasone dipropionate 0.05 % cream 1 application to affected area     brimonidine (ALPHAGAN P) 0.1 % SOLN Place 1 drop into both eyes 2 (two) times daily.     brimonidine (ALPHAGAN P) 0.15 % ophthalmic solution      calcium carbonate (OS-CAL) 600 MG TABS tablet Take 600 mg by mouth 2 (two) times daily with a meal.     celecoxib (CELEBREX) 200 MG capsule TAKE 1 CAPSULE (200 MG TOTAL) BY MOUTH 2 (TWO) TIMES DAILY.     cholecalciferol (VITAMIN D) 1000 UNITS tablet Take 1,000 Units by mouth daily.     clobetasol ointment (TEMOVATE) 0.05 % daily as needed.     Coenzyme Q10 (COQ-10 PO) Take 1 capsule by mouth daily.      Cyanocobalamin (VITAMIN B-12 PO) Take by mouth daily. Patient states she takes sublingually     diclofenac Sodium (VOLTAREN) 1 % GEL Apply topically.     EPINEPHrine (EPIPEN 2-PAK) 0.3 mg/0.3 mL IJ SOAJ injection 1 injection (Patient not taking: Reported on 08/01/2022)     estradiol (ESTRACE) 0.1 MG/GM vaginal cream 2 (two) times a week.     hydrochlorothiazide (HYDRODIURIL) 25 MG tablet Take 25 mg by mouth daily.      hydrocortisone 2.5 % cream if needed each day.     lansoprazole (PREVACID) 30 MG capsule Take 30 mg by mouth daily at 12 noon.     latanoprost (XALATAN) 0.005 % ophthalmic solution 1 drop into affected eye in the evening     levothyroxine (SYNTHROID) 25 MCG tablet Take by mouth.     lidocaine (LIDODERM) 5 % APPLY 1 TO 3 PATCHES  TOPICALLY TO INTACT SKIN AS NEEDED REMOVE AFTER 12  HOURS ONCE DAILY     magnesium oxide (MAG-OX) 400 MG tablet Take by mouth.     metoprolol succinate (TOPROL-XL) 25 MG 24 hr tablet Take 12.5 mg by mouth.     Milk Thistle 140 MG CAPS Take 1 capsule by mouth daily.      No current facility-administered medications on file prior to visit.    Allergies  Allergen Reactions   Sulfa Antibiotics Rash   Aspirin Hives   Latex Hives   Lisinopril Swelling   Percocet [Oxycodone-Acetaminophen]     DIZZINESS    Tramadol Hcl Other (See Comments)    Elevated BP/HR, swelling of tongue/arm    Review of Systems No fevers, chills, nausea, muscle aches, no difficulty breathing, no calf pain, no chest pain or shortness of breath.   Physical Exam  GENERAL APPEARANCE:  Alert, conversant. Appropriately groomed. No acute distress.   VASCULAR: Pedal pulses palpable 2/4 DP and PT bilateral.  Capillary refill time is immediate to all digits,  Proximal to distal cooling is warm to warm.  Digital perfusion adequate.   NEUROLOGIC: sensation is intact to 5.07 monofilament at 5/5 sites bilateral.  Light touch is intact bilateral, vibratory sensation intact bilateral  MUSCULOSKELETAL: acceptable muscle strength, tone and stability bilateral.  No gross boney pedal deformities noted.  Pain on palpation along the fourth metatarsal of the right foot is identified.  Pain also along the peroneal tendon posterior to the lateral malleolus is also noted.  Mild swelling lateral midfoot is also noted. Pain improved on right. Now pain to left sinus tarsia area and pain with ROM of the subtalar joint.    DERMATOLOGIC: skin is warm, supple, and dry.  Color, texture, and turgor of skin within normal limits.  No open wounds are noted.  No preulcerative lesions are seen.  Digital nails are asymptomatic.    X-ray evaluation 3 views of the right foot are obtained.  Decreased joint space and arthritic changes are noted at the first metatarsal phalangeal joint as well as the hallux interphalangeal joint.  Contracture of the second digit is noted on x-ray.  No sign of stress fracture or stress reaction is seen third, fourth or fifth metatarsals.  A large osteophytic spur on the dorsal aspect of the first metatarsal phalangeal joint is noted on the lateral view.  Tiny inferior calcaneal spur is noted.  Comparison with x-rays from August 2022 showed no changes.  MRI right foot  IMPRESSION: 1. No evidence of fracture or osteonecrosis. 2. 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 3. Mild osteoarthritis of the first interphalangeal joint. 4. Mild arthritis of the second metatarsophalangeal joint with small joint effusion.  Assessment     ICD-10-CM   1. 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   Discussed sinus tarsia syndrome on left and treatment options.  -Discussed treatement options for possible stress fracture or reaction vs arthritis; risks, alternatives, and benefits explained..  -Recommend protection, rest, ice, elevation daily until symptoms improve -Rx pain med/antinflammatories as needed -MRI reviewed and no stress fracture noted. Likely arthritis from RA.  Discussed continuing injections as needed in future. Did discuss surgery but patient would like to avoid.  -Patient to return to  office as needed  Procedure: Injection Tendon/Ligament Discussed alternatives, risks, complications and verbal consent was obtained.  Location: Left sinus tarsi. Skin Prep: Alcohol. Injectate: 1cc 0.5% marcaine plain, 1 cc dexamethasone 0.5 cc kenalog  Disposition: Patient tolerated procedure well. Injection site dressed with a band-aid.  Post-injection care was discussed and return precautions discussed.

## 2023-06-26 ENCOUNTER — Ambulatory Visit: Payer: Medicare Other | Admitting: Podiatry

## 2023-07-13 DIAGNOSIS — B029 Zoster without complications: Secondary | ICD-10-CM | POA: Diagnosis not present

## 2023-07-15 DIAGNOSIS — M069 Rheumatoid arthritis, unspecified: Secondary | ICD-10-CM | POA: Diagnosis not present

## 2023-07-15 DIAGNOSIS — I10 Essential (primary) hypertension: Secondary | ICD-10-CM | POA: Diagnosis not present

## 2023-07-15 DIAGNOSIS — E039 Hypothyroidism, unspecified: Secondary | ICD-10-CM | POA: Diagnosis not present

## 2023-07-15 DIAGNOSIS — M869 Osteomyelitis, unspecified: Secondary | ICD-10-CM | POA: Diagnosis not present

## 2023-07-15 DIAGNOSIS — Z0000 Encounter for general adult medical examination without abnormal findings: Secondary | ICD-10-CM | POA: Diagnosis not present

## 2023-07-20 ENCOUNTER — Ambulatory Visit: Admission: RE | Admit: 2023-07-20 | Payer: Medicare Other | Source: Ambulatory Visit

## 2023-07-20 DIAGNOSIS — B029 Zoster without complications: Secondary | ICD-10-CM | POA: Diagnosis not present

## 2023-07-20 DIAGNOSIS — Z1231 Encounter for screening mammogram for malignant neoplasm of breast: Secondary | ICD-10-CM

## 2023-07-20 DIAGNOSIS — Z01419 Encounter for gynecological examination (general) (routine) without abnormal findings: Secondary | ICD-10-CM | POA: Diagnosis not present

## 2023-07-22 ENCOUNTER — Other Ambulatory Visit: Payer: Self-pay | Admitting: Obstetrics and Gynecology

## 2023-07-22 DIAGNOSIS — R928 Other abnormal and inconclusive findings on diagnostic imaging of breast: Secondary | ICD-10-CM

## 2023-07-27 ENCOUNTER — Encounter: Payer: Self-pay | Admitting: Obstetrics and Gynecology

## 2023-07-28 ENCOUNTER — Ambulatory Visit: Admission: RE | Admit: 2023-07-28 | Payer: Medicare Other | Source: Ambulatory Visit

## 2023-07-28 DIAGNOSIS — R928 Other abnormal and inconclusive findings on diagnostic imaging of breast: Secondary | ICD-10-CM

## 2023-08-03 DIAGNOSIS — T8450XD Infection and inflammatory reaction due to unspecified internal joint prosthesis, subsequent encounter: Secondary | ICD-10-CM | POA: Diagnosis not present

## 2023-08-05 DIAGNOSIS — M069 Rheumatoid arthritis, unspecified: Secondary | ICD-10-CM | POA: Diagnosis not present
